# Patient Record
Sex: Female | Born: 1970 | Race: White | Hispanic: No | Smoking: Never smoker
Health system: Southern US, Community
[De-identification: ages and names within clinical notes are randomized; demographics above are authoritative.]

## PROBLEM LIST (undated history)

## (undated) HISTORY — PX: EYE SURGERY: SHX253

## (undated) HISTORY — PX: JOINT REPLACEMENT: SHX530

---

## 2015-11-13 LAB — HM MAMMOGRAPHY

## 2017-04-21 DIAGNOSIS — L237 Allergic contact dermatitis due to plants, except food: Secondary | ICD-10-CM | POA: Diagnosis not present

## 2017-04-24 DIAGNOSIS — L237 Allergic contact dermatitis due to plants, except food: Secondary | ICD-10-CM | POA: Diagnosis not present

## 2018-04-02 ENCOUNTER — Ambulatory Visit (INDEPENDENT_AMBULATORY_CARE_PROVIDER_SITE_OTHER): Payer: Commercial Managed Care - PPO

## 2018-04-02 ENCOUNTER — Ambulatory Visit: Payer: Commercial Managed Care - PPO | Admitting: Family Medicine

## 2018-04-02 ENCOUNTER — Other Ambulatory Visit: Payer: Self-pay

## 2018-04-02 ENCOUNTER — Encounter: Payer: Self-pay | Admitting: Family Medicine

## 2018-04-02 ENCOUNTER — Other Ambulatory Visit (HOSPITAL_COMMUNITY)
Admission: RE | Admit: 2018-04-02 | Discharge: 2018-04-02 | Disposition: A | Discharge status: Home / Self Care | Payer: Commercial Managed Care - PPO | Source: Ambulatory Visit | Attending: Family Medicine | Admitting: Family Medicine

## 2018-04-02 VITALS — BP 110/70 | HR 62 | Temp 98.0°F | Resp 16 | Ht 68.0 in | Wt 140.6 lb

## 2018-04-02 DIAGNOSIS — Z780 Asymptomatic menopausal state: Secondary | ICD-10-CM | POA: Diagnosis not present

## 2018-04-02 DIAGNOSIS — Z0000 Encounter for general adult medical examination without abnormal findings: Secondary | ICD-10-CM | POA: Insufficient documentation

## 2018-04-02 DIAGNOSIS — M25552 Pain in left hip: Secondary | ICD-10-CM

## 2018-04-02 DIAGNOSIS — Z124 Encounter for screening for malignant neoplasm of cervix: Secondary | ICD-10-CM | POA: Insufficient documentation

## 2018-04-02 NOTE — Progress Notes (Signed)
Subjective  Chief Complaint  Patient presents with  . Establish Care    Recently moved in the area, last PCP/CPE was 2016  . Annual Exam    HPI: Kelly [name] is a 48 y.o. female who presents to [place] [facility] today for a Female Wellness Visit.   Wellness Visit: annual visit with health maintenance review and exam with Pap   48 year old female, married mother of 2 teenagers, originally from [place] and moved down to [place] from outside of [place] a year and half ago.  Her husband started a new Architect business.  Doing very well.  Lives a very healthy lifestyle.  Working out with a Clinical research associate.  Has no chronic medical problems.  Left hip pain: Started with mild symptoms after childbirth about 15 years ago.  Would only intermittently bother her.  Now having daily pain, limited range of motion, pain with abduction and walking.  No injury.  She was a former Engineer, maintenance (IT).  No knee pain or ankle pain.  No back pain.  Occasionally will use ibuprofen but does not help really.  Pain does not interfere with sleep.  Family history significant for osteoporosis in her mother.  Health maintenance: Due for mammogram and Pap smear.  Thinks her Tdap is up-to-date and she will get records.  Declines flu shot.  Assessment  1. Annual physical exam   2. Cervical cancer screening   3. Postmenopausal   4. Left hip pain      Plan  Female Wellness Visit:  Age appropriate Health Maintenance and Prevention measures were discussed with patient. Included topics are cancer screening recommendations, ways to keep healthy (see AVS) including dietary and exercise recommendations, regular eye and dental care, use of seat belts, and avoidance of moderate alcohol use and tobacco use.   BMI: discussed patient's BMI and encouraged positive lifestyle modifications to help get to or maintain a target BMI.  HM needs and immunizations were addressed and ordered. See below for orders.  See HM and immunization section for updates.  Routine labs and screening tests ordered including cmp, cbc and lipids where appropriate.  Discussed recommendations regarding Vit D and calcium supplementation (see AVS)  Left hip pain: Worrisome for osteoarthritis or avascular necrosis of the hip.  Start with x-ray.  Further evaluation and treatment pending results.  Postmenopausal with recent mild bleeding after starting Estroven.  Could be due to estrogen.  Check FSH.  Consider endometrial biopsy and or ultrasound.  Patient understands and agrees with care plan.  Follow up: Return in about 1 year (around 04/03/2019) for complete physical.   Orders Placed This Encounter  Procedures  . MM DIGITAL SCREENING BILATERAL  . DG HIP UNILAT WITH PELVIS 2-3 VIEWS LEFT  . CBC with Differential/Platelet  . Comprehensive metabolic panel  . Lipid panel  . HIV Antibody (routine testing w rflx)  . [place]   No orders of the defined types were placed in this encounter.     Lifestyle: Body mass index is 21.38 kg/m. Wt Readings from Last 3 Encounters:  04/02/18 140 lb 9.6 oz (63.8 kg)     There are no active problems to display for this patient.  Health Maintenance  Topic Date Due  . HIV Screening  02/12/1984  . TETANUS/TDAP  02/12/1988  . PAP SMEAR-Modifier  02/11/1990  . INFLUENZA VACCINE  Discontinued    There is no immunization history on file for this patient. We updated and reviewed the patient's past history in detail and  it is documented below. Allergies: Patient has No Known Allergies. Past Medical History Patient  has no past medical history on file. Past Surgical History Patient  has no past surgical history on file. Family History: Patient family history includes Alcohol abuse in her brother and mother; COPD in her maternal grandmother; Cancer in her paternal grandmother; High blood pressure in her father; Stroke in her paternal grandfather. Social History:  Patient  reports  that she has never smoked. She has never used smokeless tobacco. She reports current alcohol use. She reports that she does not use drugs.  Review of Systems: Constitutional: negative for fever or malaise Ophthalmic: negative for photophobia, double vision or loss of vision Cardiovascular: negative for chest pain, dyspnea on exertion, or new LE swelling Respiratory: negative for SOB or persistent cough Gastrointestinal: negative for abdominal pain, change in bowel habits or melena Genitourinary: negative for dysuria or gross hematuria, no abnormal uterine bleeding or disharge Musculoskeletal: Positive for new gait disturbance without muscular weakness Integumentary: negative for new or persistent rashes, no breast lumps Neurological: negative for TIA or stroke symptoms Psychiatric: negative for SI or delusions Allergic/Immunologic: negative for hives Patient Care Team    Relationship Specialty Notifications Start End  Leamon Arnt, MD PCP - General Family Medicine  04/02/18     Objective  Vitals: BP 110/70   Pulse 62   Temp 98 F (36.7 C) (Oral)   Resp 16   Ht 5\' 8"  (1.727 m)   Wt 140 lb 9.6 oz (63.8 kg)   SpO2 99%   BMI 21.38 kg/m  General:  Well developed, well nourished, no acute distress  Psych:  Alert and orientedx3,normal mood and affect HEENT:  Normocephalic, atraumatic, non-icteric sclera, PERRL, oropharynx is clear without mass or exudate, supple neck without adenopathy, mass or thyromegaly Cardiovascular:  Normal S1, S2, RRR without gallop, rub or murmur, nondisplaced PMI Respiratory:  Good breath sounds bilaterally, CTAB with normal respiratory effort Gastrointestinal: normal bowel sounds, soft, non-tender, no noted masses. No HSM MSK: no deformities, contusions. Joints are without erythema or swelling. Spine and CVA region are nontender.  Left hip with decreased flexion and mild pain with full external rotation.  No lateral tenderness. Skin:  Warm, no rashes or  suspicious lesions noted Neurologic:    Mental status is normal. CN 2-11 are normal. Gross motor and sensory exams are normal. Normal gait. No tremor Breast Exam: No mass, skin retraction or nipple discharge is appreciated in either breast. No axillary adenopathy. Fibrocystic changes are not noted Pelvic Exam: Normal external genitalia, no vulvar or vaginal lesions present. Clear cervix w/o CMT. Bimanual exam reveals a nontender fundus w/o masses, nl size. No adnexal masses present. No inguinal adenopathy. A PAP smear was performed.    Commons side effects, risks, benefits, and alternatives for medications and treatment plan prescribed today were discussed, and the patient expressed understanding of the given instructions. Patient is instructed to call or message via MyChart if he/she has any questions or concerns regarding our treatment plan. No barriers to understanding were identified. We discussed Red Flag symptoms and signs in detail. Patient expressed understanding regarding what to do in case of urgent or emergency type symptoms.   Medication list was reconciled, printed and provided to the patient in AVS. Patient instructions and summary information was reviewed with the patient as documented in the AVS. This note was prepared with assistance of Dragon voice recognition software. Occasional wrong-word or sound-a-like substitutions may have occurred due to the  inherent limitations of voice recognition software

## 2018-04-02 NOTE — Patient Instructions (Addendum)
Please return in 12 months for your annual complete physical; please come fasting.  Please go to our [facility] office to get your xrays done. You can walk in M-F between 8am and 5pm. Tell them you are there for xrays ordered by me. They will send me the results, then I will let you know the results with instructions.   Address: [place], [place], [place]  (office sits at [address] at [address] intersection; from here, turn left onto [place] [address]), take to [address], turn right and go for a mile or so, office will be on left across form [employer] )  Please let me know when you had your last Tdap.   I will release your lab results to you on your MyChart account with further instructions. Please reply with any questions. Please sign up.  It was a pleasure meeting you today! Thank you for choosing [place] to meet your healthcare needs! I truly look forward to working with you. If you have any questions or concerns, please send me a message via Mychart or call the office at [phone].  Please do these things to maintain good health!   Exercise at least 30-45 minutes a day,  4-5 days a week.   Eat a low-fat diet with lots of fruits and vegetables, up to 7-9 servings per day.  Drink plenty of water daily. Try to drink 8 8oz glasses per day.  Seatbelts can save your life. Always wear your seatbelt.  Place Smoke Detectors on every level of your home and check batteries every year.  Schedule an appointment with an eye doctor for an eye exam every 1-2 years  Safe sex - use condoms to protect yourself from STDs if you could be exposed to these types of infections. Use birth control if you do not want to become pregnant and are sexually active.  Avoid heavy alcohol use. If you drink, keep it to less than 2 drinks/day and not every day.  [place].  Choose someone you trust that could speak for you  if you became unable to speak for yourself.  Depression is common in our stressful world.If you're feeling down or losing interest in things you normally enjoy, please come in for a visit.  If anyone is threatening or hurting you, please get help. Physical or Emotional Violence is never OK.    Calcium Intake Recommendations You can take Caltrate Plus twice a day or get it through your diet or other OTC supplements (Viactiv, OsCal etc)  Calcium is a mineral that affects many functions in the body, including:  Blood clotting.  Blood vessel function.  Nerve impulse conduction.  Hormone secretion.  Muscle contraction.  Bone and teeth functions.  Most of your body's calcium supply is stored in your bones and teeth. When your calcium stores are low, you may be at risk for low bone mass, bone loss, and bone fractures. Consuming enough calcium helps to grow healthy bones and teeth and to prevent breakdown over time. It is very important that you get enough calcium if you are:  A child undergoing rapid growth.  An adolescent girl.  A pre- or post-menopausal woman.  A woman whose menstrual cycle has stopped due to anorexia nervosa or regular intense exercise.  An individual with lactose intolerance or a milk allergy.  A vegetarian.  What is my plan? Try to consume the recommended amount of calcium daily  based on your age. Depending on your overall health, your health care provider may recommend increased calcium intake.General daily calcium intake recommendations by age are:  Birth to 6 months: 200 mg.  Infants 7 to 12 months: 260 mg.  Children 1 to 3 years: 700 mg.  Children 4 to 8 years: 1,000 mg.  Children 9 to 13 years: 1,300 mg.  Teens 14 to 18 years: 1,300 mg.  Adults 19 to 50 years: 1,000 mg.  Adult women 51 to 70 years: 1,200 mg.  Adult men 51 to 70 years: 1,000 mg.  Adults 71 years and older: 1,200 mg.  Pregnant and breastfeeding teens: 1,300  mg.  Pregnant and breastfeeding adults: 1,000 mg.  What do I need to know about calcium intake?  In order for the body to absorb calcium, it needs vitamin D. You can get vitamin D through (we recommend getting [phone] units of Vitamin D daily) ? Direct exposure of the skin to sunlight. ? Foods, such as egg yolks, liver, saltwater fish, and fortified milk. ? Supplements.  Consuming too much calcium may cause: ? Constipation. ? Decreased absorption of iron and zinc. ? Kidney stones.  Calcium supplements may interact with certain medicines. Check with your health care provider before starting any calcium supplements.  Try to get most of your calcium from food. What foods can I eat? Grains  Fortified oatmeal. Fortified ready-to-eat cereals. Fortified frozen waffles. Vegetables Turnip greens. Broccoli. Fruits Fortified orange juice. Meats and Other Protein Sources Canned sardines with bones. Canned salmon with bones. Soy beans. Tofu. Baked beans. Almonds. [place] nuts. Sunflower seeds. Dairy Milk. Yogurt. Cheese. Cottage cheese. Beverages Fortified soy milk. Fortified rice milk. Sweets/Desserts Pudding. Ice Cream. Milkshakes. Blackstrap molasses. The items listed above may not be a complete list of recommended foods or beverages. Contact your dietitian for more options. What foods can affect my calcium intake? It may be more difficult for your body to use calcium or calcium may leave your body more quickly if you consume large amounts of:  Sodium.  Protein.  Caffeine.  Alcohol.  This information is not intended to replace advice given to you by your health care provider. Make sure you discuss any questions you have with your health care provider. Document Released: 10/17/2003 Document Revised: 09/22/2015 Document Reviewed: 08/10/2013 Elsevier Interactive Patient Education  2018 [employer].

## 2018-04-03 LAB — CBC WITH DIFFERENTIAL/PLATELET
BASOS ABS: 0.1 10*3/uL (ref 0.0–0.1)
BASOS PCT: 0.7 % (ref 0.0–3.0)
EOS ABS: 0.1 10*3/uL (ref 0.0–0.7)
Eosinophils Relative: 1.3 % (ref 0.0–5.0)
HCT: 37.1 % (ref 36.0–46.0)
Hemoglobin: 12.6 g/dL (ref 12.0–15.0)
Lymphocytes Relative: 21.7 % (ref 12.0–46.0)
Lymphs Abs: 1.5 10*3/uL (ref 0.7–4.0)
MCHC: 34 g/dL (ref 30.0–36.0)
MCV: 92.7 fl (ref 78.0–100.0)
Monocytes Absolute: 0.5 10*3/uL (ref 0.1–1.0)
Monocytes Relative: 6.6 % (ref 3.0–12.0)
Neutro Abs: 4.8 10*3/uL (ref 1.4–7.7)
Neutrophils Relative %: 69.7 % (ref 43.0–77.0)
PLATELETS: 247 10*3/uL (ref 150.0–400.0)
RBC: 4 Mil/uL (ref 3.87–5.11)
RDW: 14.3 % (ref 11.5–15.5)
WBC: 7 10*3/uL (ref 4.0–10.5)

## 2018-04-03 LAB — COMPREHENSIVE METABOLIC PANEL
ALT: 11 U/L (ref 0–35)
AST: 15 U/L (ref 0–37)
Albumin: 4.4 g/dL (ref 3.5–5.2)
Alkaline Phosphatase: 46 U/L (ref 39–117)
BUN: 14 mg/dL (ref 6–23)
CO2: 30 meq/L (ref 19–32)
Calcium: 9.6 mg/dL (ref 8.4–10.5)
Chloride: 100 mEq/L (ref 96–112)
Creatinine, Ser: 0.98 mg/dL (ref 0.40–1.20)
GFR: 60.28 mL/min (ref >60.00)
Glucose, Bld: 87 mg/dL (ref 70–99)
Potassium: 3.9 mEq/L (ref 3.5–5.1)
Sodium: 138 mEq/L (ref 135–145)
Total Bilirubin: 1 mg/dL (ref 0.2–1.2)
Total Protein: 6.7 g/dL (ref 6.0–8.3)

## 2018-04-03 LAB — LIPID PANEL
Cholesterol: 193 mg/dL (ref 0–200)
HDL: 70.3 mg/dL (ref >39.00)
LDL Cholesterol: 98 mg/dL (ref 0–99)
NonHDL: 122.58
TRIGLYCERIDES: 125 mg/dL (ref 0.0–149.0)
Total CHOL/HDL Ratio: 3
VLDL: 25 mg/dL (ref 0.0–40.0)

## 2018-04-03 LAB — HIV ANTIBODY (ROUTINE TESTING W REFLEX): HIV 1&2 Ab, 4th Generation: NONREACTIVE

## 2018-04-03 LAB — FOLLICLE STIMULATING HORMONE: FSH: 19.9 m[IU]/mL

## 2018-04-03 NOTE — Progress Notes (Unsigned)
Referred to sports med. Nl xray

## 2018-04-07 ENCOUNTER — Encounter: Payer: Self-pay | Admitting: Family Medicine

## 2018-04-07 LAB — CYTOLOGY - PAP
Diagnosis: NEGATIVE
HPV: NOT DETECTED

## 2018-04-08 ENCOUNTER — Ambulatory Visit (INDEPENDENT_AMBULATORY_CARE_PROVIDER_SITE_OTHER): Payer: Commercial Managed Care - PPO

## 2018-04-08 DIAGNOSIS — Z23 Encounter for immunization: Secondary | ICD-10-CM | POA: Diagnosis not present

## 2018-04-08 NOTE — Progress Notes (Signed)
Administered Tdap booster into patient left arm IM. Tolerated well. Informed patient to massage area over the course of the next few days.

## 2018-04-10 ENCOUNTER — Ambulatory Visit: Payer: Commercial Managed Care - PPO | Admitting: Sports Medicine

## 2018-04-10 ENCOUNTER — Encounter: Payer: Self-pay | Admitting: Sports Medicine

## 2018-04-10 ENCOUNTER — Ambulatory Visit: Payer: Self-pay

## 2018-04-10 VITALS — BP 110/78 | HR 51 | Ht 68.0 in | Wt 139.4 lb

## 2018-04-10 DIAGNOSIS — M25552 Pain in left hip: Secondary | ICD-10-CM | POA: Diagnosis not present

## 2018-04-10 NOTE — Patient Instructions (Addendum)

## 2018-04-10 NOTE — Progress Notes (Signed)
Kelly [name]. Kelly [name], [facility]  Kelly [name] - 48 y.o. female MRN [id number]  Date of birth: 1970-09-01  Visit Date: 04/10/2018   PCP: Leamon Arnt, MD   Referred by: Leamon Arnt, MD  SUBJECTIVE:  Chief Complaint  Patient presents with  . Left Hip - Initial Assessment    XR L hip 04/02/2018. Has been seen by chiropractor in the past. IBU prn.   . Initial Assessment    Referred by Dr. Billey Chang.     HPI: Patient presents with left hip clicking and popping that is been present for several years.  It has been worsening over the past 1 year.  Is gotten to the point that she is having a hard time working out and performing squats and dead lifts.  She does have some popping that localizes to the posterior buttock and groin.  She has had decreased range of motion and has changed her stride informed and working out due to this.  She has not been taking any medications on a regular basis but has a chiropractor for this with no significant improvement.  She is occasional ibuprofen has been beneficial.  She denies any swelling numbness or weakness in the leg.  She denies any changes in bowel or bladder and reports no radicular component to this.  Symptoms did seem to began following childbirth and have been progressive since that time.  REVIEW OF SYSTEMS: 12 point review of systems performed negative except for as outlined per HPI.  HISTORY:  Prior history reviewed and updated per electronic medical record.  Social History   Occupational History  . Not on file  Tobacco Use  . Smoking status: Never Smoker  . Smokeless tobacco: Never Used  Substance and Sexual Activity  . Alcohol use: Yes    Comment: beer or wine - seldom use  . Drug use: Never  . Sexual activity: Yes    Partners: Male    Birth control/protection: Post-menopausal   Social History   Social History Narrative  . Not on file   No past  medical history on file. No past surgical history on file. family history includes Alcohol abuse in her brother and mother; COPD in her maternal grandmother; Cancer in her paternal grandmother; Healthy in her daughter and son; High blood pressure in her father; Osteoporosis in her mother; Stroke in her paternal grandfather.  DATA OBTAINED & REVIEWED:  Recent Labs    04/02/18 1447  CALCIUM 9.6  AST 15  ALT 11    No problems updated. No specialty comments available. 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  OBJECTIVE:  VS:  HT:5\' 8"  (172.7 cm)   WT:139 lb 6.4 oz (63.2 kg)  BMI:21.2    BP:110/78  HR:(!) 51bpm  TEMP: ( )  RESP:97 %   PHYSICAL EXAM: CONSTITUTIONAL: Well-developed, Well-nourished and In no acute distress EYES: Pupils are equal., EOM intact without nystagmus. and No scleral icterus. Psychiatric: Alert & appropriately interactive. and Not depressed or anxious appearing. EXTREMITY EXAM: Warm and well perfused  Left HIP Exam: Well aligned, no significant deformity. No overlying skin changes. TTP over Gluteal musculature minimally. Non tender over Lower lumbar spine.  Passive Flexion/Extension(Optional): normal, no pain Passive Internal & External Rotation(Optional): normal, no pain Log Roll(Optional): normal, no pain FADIR(Optional): positive, mild pain positive, mild pain Stinchfield testing(Optional): positive, moderate  painStrength: Normal Axial loading produces: Moderate pain and No crepitation    ASSESSMENT   1. Left hip pain      PROCEDURES:  Ultrasound-guided injection  PLAN:  Pertinent additional documentation may be included in corresponding procedure notes, imaging studies, problem based documentation and patient instructions.  Ultimately she does have a small left hip effusion.  I am concerned for possible early radio occult  osteoarthritis given the cystic changes on acetabulum versus possible labral tear.  She does have a markedly tight hip flexor on the left as well and will benefit from gentle stretching of the hip flexors however I like to see her significantly improved prior to beginning working on a program.  Activity modifications and the importance of avoiding exacerbating activities (limiting pain to no more than a 4 / 10 during or following activity) recommended and discussed.  Discussed red flag symptoms that warrant earlier emergent evaluation and patient voices understanding.  At follow up will plan to consider repeat MSK Ultrasound and likely initiate Home therapeutic exercises  Return in about 6 weeks (around 05/22/2018).          Gerda Diss, [facility] Physician

## 2018-04-10 NOTE — Procedures (Signed)
PROCEDURE NOTE:  Ultrasound Guided: Injection: Left hip Images were obtained and interpreted by myself, Teresa Coombs, DO  Images have been saved and stored to PACS system. Images obtained on: GE S7 Ultrasound machine    ULTRASOUND FINDINGS:  Small joint effusion.  Questionable labral irregularity but inconclusive.  DESCRIPTION OF PROCEDURE:  The patient's clinical condition is marked by substantial pain and/or significant functional disability. Other conservative therapy has not provided relief, is contraindicated, or not appropriate. There is a reasonable likelihood that injection will significantly improve the patient's pain and/or functional impairment.   After discussing the risks, benefits and expected outcomes of the injection and all questions were reviewed and answered, the patient wished to undergo the above named procedure.  Verbal consent was obtained.  The ultrasound was used to identify the target structure and adjacent neurovascular structures. The skin was then prepped in sterile fashion and the target structure was injected under direct visualization using sterile technique as below:  Single injection performed as below: PREP: Alcohol and Ethel Chloride APPROACH:direct, stopcock technique, 22g 3.5 in. INJECTATE: 5 cc 1% lidocaine, 2 cc 0.5% Marcaine and 2 cc 40mg /mL DepoMedrol ASPIRATE: None DRESSING: Band-Aid  Post procedural instructions including recommending icing and warning signs for infection were reviewed.    This procedure was well tolerated and there were no complications.   IMPRESSION: Succesful Ultrasound Guided: Injection

## 2018-04-11 ENCOUNTER — Encounter: Payer: Self-pay | Admitting: Sports Medicine

## 2018-04-13 DIAGNOSIS — M9902 Segmental and somatic dysfunction of thoracic region: Secondary | ICD-10-CM | POA: Diagnosis not present

## 2018-04-14 DIAGNOSIS — M9902 Segmental and somatic dysfunction of thoracic region: Secondary | ICD-10-CM | POA: Diagnosis not present

## 2018-04-16 DIAGNOSIS — M9902 Segmental and somatic dysfunction of thoracic region: Secondary | ICD-10-CM | POA: Diagnosis not present

## 2018-04-20 DIAGNOSIS — M9902 Segmental and somatic dysfunction of thoracic region: Secondary | ICD-10-CM | POA: Diagnosis not present

## 2018-04-22 DIAGNOSIS — M9902 Segmental and somatic dysfunction of thoracic region: Secondary | ICD-10-CM | POA: Diagnosis not present

## 2018-04-24 ENCOUNTER — Encounter: Payer: Self-pay | Admitting: *Deleted

## 2018-04-27 DIAGNOSIS — M9902 Segmental and somatic dysfunction of thoracic region: Secondary | ICD-10-CM | POA: Diagnosis not present

## 2018-04-30 DIAGNOSIS — M9902 Segmental and somatic dysfunction of thoracic region: Secondary | ICD-10-CM | POA: Diagnosis not present

## 2018-05-05 DIAGNOSIS — M9902 Segmental and somatic dysfunction of thoracic region: Secondary | ICD-10-CM | POA: Diagnosis not present

## 2018-05-06 ENCOUNTER — Ambulatory Visit
Admission: RE | Admit: 2018-05-06 | Discharge: 2018-05-06 | Disposition: A | Discharge status: Home / Self Care | Payer: Commercial Managed Care - PPO | Source: Ambulatory Visit | Attending: Family Medicine | Admitting: Family Medicine

## 2018-05-06 DIAGNOSIS — Z0000 Encounter for general adult medical examination without abnormal findings: Secondary | ICD-10-CM

## 2018-05-06 DIAGNOSIS — Z1231 Encounter for screening mammogram for malignant neoplasm of breast: Secondary | ICD-10-CM | POA: Diagnosis not present

## 2018-05-07 DIAGNOSIS — M9902 Segmental and somatic dysfunction of thoracic region: Secondary | ICD-10-CM | POA: Diagnosis not present

## 2018-05-22 ENCOUNTER — Ambulatory Visit: Payer: Commercial Managed Care - PPO | Admitting: Sports Medicine

## 2018-05-22 ENCOUNTER — Telehealth: Payer: Self-pay

## 2018-05-22 ENCOUNTER — Encounter: Payer: Self-pay | Admitting: Sports Medicine

## 2018-05-22 DIAGNOSIS — M25552 Pain in left hip: Secondary | ICD-10-CM | POA: Diagnosis not present

## 2018-05-22 MED ORDER — CELECOXIB 100 MG PO CAPS: 100.0000 mg | ORAL_CAPSULE | Freq: Two times a day (BID) | ORAL | 2 refills | Status: DC | PRN

## 2018-05-22 NOTE — Patient Instructions (Signed)
We are ordering an MRI for you today.  The imaging office will be calling you to schedule your appointment after we obtain authorization from your insurance company.   Please be sure you have signed up for MyChart so that we can get your results to you.  We will be in touch with you as soon as we can.  Please know, it can take up to 3-4 business days for the radiologist and Dr. Paulla Fore to have time to review the results and determine the best appropriate action.  If there is something that appears to be surgical or needs a referral to other specialists we will let you know through [place] or telephone.  Otherwise we will plan to schedule a follow up appointment with Dr. Paulla Fore once we have the results. Please call our office [phone]) to schedule a follow-up visit for 2-3 days after your MRI.  [facility]: [phone]

## 2018-05-22 NOTE — Progress Notes (Signed)
Juanda Bond. , [facility] [place]  Lelani Garnett - 48 y.o. female MRN [id number]  Date of birth: 1971/03/07  Visit Date: May 22, 2018  PCP: Leamon Arnt, MD   Referred by: Leamon Arnt, MD  SUBJECTIVE:  Chief Complaint  Patient presents with  . Follow-up    L hip pain and mechanical symptoms.  2/2 Depo 40 injection - 04/10/18    HPI: Patient is here for 6-week follow-up after intra-articular left hip injection.  She reported almost 2 full weeks of complete relief of her symptoms out to the 3-week mark.  However over the past 3 weeks her symptoms have begun to return quite rapidly and she is now back to having constant, moderate throbbing pain and occasional stabbing pain with movements.  She is getting clicking and popping.  Pain does radiate into the left thigh and previously was radiating to the posterior aspect of the iliac crest.  It is worse with squats and loaded hip flexion.  She is previously undergone chiropractic therapy as well as injection therapy that provided only temporary relief.  She is highly active fit individual who participates in softball and racquetball on a regular basis.  She is not doing any high-impact running but would like to try to get back into Trail running if possible.  REVIEW OF SYSTEMS: No significant nighttime awakenings due to this issue. Denies fevers, chills, recent weight gain or weight loss.  No night sweats.  Pt denies any change in bowel or bladder habits, muscle weakness, numbness or falls associated with this pain.  HISTORY:  Prior history reviewed and updated per electronic medical record.  Patient Active Problem List   Diagnosis Date Noted  . Left hip pain 05/22/2018    L hip XR - 04/02/18    Social History   Occupational History  . Not on file  Tobacco Use  . Smoking status: Never Smoker  . Smokeless tobacco: Never Used  Substance and Sexual Activity  .  Alcohol use: Yes    Comment: beer or wine - seldom use  . Drug use: Never  . Sexual activity: Yes    Partners: Male    Birth control/protection: Post-menopausal   Social History   Social History Narrative  . Not on file     OBJECTIVE:  VS:  HT:5\' 8"  (172.7 cm)   WT:133 lb 6.4 oz (60.5 kg)  BMI:20.29    BP:134/74  HR:65bpm  TEMP: ( )  RESP:98 %   PHYSICAL EXAM: Adult female. No acute distress.  Alert and appropriate. Left hip is slightly guarded with flexion and extension.  She has mild pain with FADIR testing.  She has pain with Bosie Clos testing as well as weakness with straight leg raise.   ASSESSMENT:  1. Left hip pain     PROCEDURES:  None  PLAN:  Pertinent additional documentation may be included in corresponding procedure notes, imaging studies, problem based documentation and patient instructions.  No problem-specific Assessment & Plan notes found for this encounter.  Given the prior findings on her x-rays or effectively normal I am concerned that she has a fairly significant labral tear.  We will then plan to set her up for an MR arthrogram and we discussed the possible findings including radiocarpal osteoarthritis versus acute labral tear.  She does understand that operative intervention may be warranted in the future versus repeat injections.  Activity modifications and trial of Celebrex discussed.  She will plan to follow-up to review the results of her MRI in person.     Activity modifications and the importance of avoiding exacerbating activities (limiting pain to no more than a 4 / 10 during or following activity) recommended and discussed.   Discussed red flag symptoms that warrant earlier emergent evaluation and patient voices understanding.    Meds ordered this encounter  Medications  . celecoxib (CELEBREX) 100 MG capsule    Sig: Take 1 capsule (100 mg total) by mouth 2 (two) times daily as needed.    Dispense:  60 capsule    Refill:  2   Lab  Orders  No laboratory test(s) ordered today   Imaging Orders  MR HIP LEFT W CONTRAST  DG FLUORO GUIDED NEEDLE PLC ASPIRATION/INJECTION LOC  Referral Orders  No referral(s) requested today      Return for MRI review in person.          Gerda Diss, [facility] Physician

## 2018-05-22 NOTE — Telephone Encounter (Signed)
Kelly [name] (Key: [id number])   This request has been approved. Please note any additional information provided by [employer] at the bottom of your screen.

## 2018-05-26 ENCOUNTER — Ambulatory Visit: Payer: Self-pay

## 2018-05-26 NOTE — Telephone Encounter (Signed)
Returned call to patient who states that she has had sudden complete hearing loss to her left ear.  She states she was out and suddenly her nose started to run and her ear clogged.  She states she can feel pressure in the ear as when you get off a plane and your ear does not open. She rates her pain as mild at 2-3. She denies dizziness and  Fever. She has ringing in the left ear. Per protocol pt will go to urgent care for evaluation of her symptoms. Care advice read to patient. Pt verbalized understanding of all instructions.  Reason for Disposition . [1] Hearing loss in one or both ears AND [2] sudden onset AND [3] present now  Answer Assessment - Initial Assessment Questions 1. LOCATION: "Which ear is involved?"       Left ear congestion 2. SENSATION: "Describe how the ear feels."      Total blocked 3. ONSET:  "When did the ear symptoms start?"       today 4. PAIN: "Do you also have an earache?" If so, ask: "How bad is it?" (Scale 1-10; or mild, moderate, severe)     2-3 5. CAUSE: "What do you think is causing the ear congestion?"     unsure 6. URI: "Do you have a runny nose or cough?"      Runny nose 7. NASAL ALLERGIES: "Are there symptoms of hay fever, such as sneezing or a clear nasal discharge?"     Clear discharge 8. PREGNANCY: "Is there any chance you are pregnant?" "When was your last menstrual period?"     No  Answer Assessment - Initial Assessment Questions 1. DESCRIPTION: "What type of hearing problem are you having? Describe it for me." (e.g., complete hearing loss, partial loss)     complete loss to left side 2. LOCATION: "One or both ears?" If one, ask: "Which ear?"    Left ear 3. SEVERITY: "Can you hear anything?" If so, ask: "What can you hear?" (e.g., ticking watch, whisper, talking)     nothing 4. ONSET: "When did this begin?" "Did it start suddenly or come on gradually?"     today 5. PATTERN: "Does this come and go, or has it been constant since it started?"  constant 6. PAIN: "Is there any pain in your ear(s)?"  (Scale 1-10; or mild, moderate, severe)     2-3 7. CAUSE: "What do you think is causing this hearing problem?"     unsure 8. OTHER SYMPTOMS: "Do you have any other symptoms?" (e.g., dizziness, ringing in ears)     Ringing in ear left 9. PREGNANCY: "Is there any chance you are pregnant?" "When was your last menstrual period?"     No  Protocols used: [place], [place]

## 2018-06-15 ENCOUNTER — Encounter: Payer: Self-pay | Admitting: Sports Medicine

## 2018-07-08 ENCOUNTER — Encounter: Payer: Self-pay | Admitting: Sports Medicine

## 2018-07-09 MED ORDER — TRAMADOL HCL 50 MG PO TABS: 50.0000 mg | ORAL_TABLET | Freq: Four times a day (QID) | ORAL | 0 refills | Status: AC | PRN

## 2018-08-05 ENCOUNTER — Other Ambulatory Visit: Payer: Commercial Managed Care - PPO

## 2018-08-12 ENCOUNTER — Encounter: Payer: Self-pay | Admitting: Sports Medicine

## 2018-08-19 ENCOUNTER — Ambulatory Visit
Admission: RE | Admit: 2018-08-19 | Discharge: 2018-08-19 | Disposition: A | Discharge status: Home / Self Care | Payer: Commercial Managed Care - PPO | Source: Ambulatory Visit | Attending: Sports Medicine | Admitting: Sports Medicine

## 2018-08-19 ENCOUNTER — Other Ambulatory Visit: Payer: Self-pay

## 2018-08-19 DIAGNOSIS — M25552 Pain in left hip: Secondary | ICD-10-CM

## 2018-08-19 MED ORDER — IOPAMIDOL (ISOVUE-M 200) INJECTION 41%
13.0000 mL | Freq: Once | INTRAMUSCULAR | Status: AC
  Administered 2018-08-19: 12 mL via INTRA_ARTICULAR

## 2018-08-25 ENCOUNTER — Other Ambulatory Visit: Payer: Self-pay

## 2018-08-25 ENCOUNTER — Encounter: Payer: Self-pay | Admitting: Family Medicine

## 2018-08-25 ENCOUNTER — Ambulatory Visit (INDEPENDENT_AMBULATORY_CARE_PROVIDER_SITE_OTHER): Payer: Commercial Managed Care - PPO | Admitting: Family Medicine

## 2018-08-25 VITALS — BP 96/70 | HR 55 | Ht 68.0 in | Wt 137.0 lb

## 2018-08-25 DIAGNOSIS — M25552 Pain in left hip: Secondary | ICD-10-CM

## 2018-08-25 DIAGNOSIS — M999 Biomechanical lesion, unspecified: Secondary | ICD-10-CM | POA: Insufficient documentation

## 2018-08-25 DIAGNOSIS — M24552 Contracture, left hip: Secondary | ICD-10-CM | POA: Diagnosis not present

## 2018-08-25 NOTE — Assessment & Plan Note (Signed)
Hip flexor tightness.  Discussed with patient in great length about icing regimen, home exercises, which activities to do which was to avoid.  We discussed posture and ergonomics.  Discussed which activities of doing which wants to avoid.  Attempted osteopathic manipulation.  Patient's MRI otherwise seems to be fairly unremarkable and I do not see anything that is truly surgical.  Follow Again in 4 weeks

## 2018-08-25 NOTE — Assessment & Plan Note (Signed)
Decision today to treat with OMT was based on Physical Exam  After verbal consent patient was treated with HVLA, ME, FPR techniques in cervical, thoracic, lumbar and sacral areas  Patient tolerated the procedure well with improvement in symptoms  Patient given exercises, stretches and lifestyle modifications  See medications in patient instructions if given  Patient will follow up in 4-8 weeks 

## 2018-08-25 NOTE — Progress Notes (Signed)
[name] [address], [place] [address] Phone: [phone] Subjective:   I Kelly [name] am serving as a Education administrator for Dr. Hulan Saas.  I'm seeing this patient by the request  of:    CC: Left hip pain  [id number]  Kelly [name] is a 48 y.o. female coming in with complaint of left hip pain. States that she feels about the same. Sore in certain positions. She was seen previously and an MRI was ordered.  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  Mostly seems to be on the anterior lateral aspect.  Sometimes can refer him posteriorly.  He tries to be active but finds it difficult.  Patient was seen previously as well by the other provider in 5 months ago given an injection with no significant benefit.    No past medical history on file. No past surgical history on file. Social History   Socioeconomic History  . Marital status: Married    Spouse name: Not on file  . Number of children: Not on file  . Years of education: Not on file  . Highest education level: Not on file  Occupational History  . Not on file  Social Needs  . Financial resource strain: Not on file  . Food insecurity:    Worry: Not on file    Inability: Not on file  . Transportation needs:    Medical: Not on file    Non-medical: Not on file  Tobacco Use  . Smoking status: Never Smoker  . Smokeless tobacco: Never Used  Substance and Sexual Activity  . Alcohol use: Yes    Comment: beer or wine - seldom use  . Drug use: Never  . Sexual activity: Yes    Partners: Male    Birth control/protection: Post-menopausal  Lifestyle  . Physical activity:    Days per week: Not on file    Minutes per session: Not on file  . Stress: Not on file  Relationships  . Social connections:    Talks on phone: Not on file    Gets together: Not on file    Attends religious service: Not on  file    Active member of club or organization: Not on file    Attends meetings of clubs or organizations: Not on file    Relationship status: Not on file  Other Topics Concern  . Not on file  Social History Narrative  . Not on file   No Known Allergies Family History  Problem Relation Age of Onset  . Alcohol abuse Mother   . Osteoporosis Mother   . High blood pressure Father   . Alcohol abuse Brother   . COPD Maternal Grandmother   . Cancer Paternal Grandmother   . Stroke Paternal Grandfather   . Healthy Daughter   . Healthy Son        Current Outpatient Medications (Analgesics):  .  celecoxib (CELEBREX) 100 MG capsule, Take 1 capsule (100 mg total) by mouth 2 (two) times daily as needed.      Past medical history, social, surgical and family history all reviewed in electronic medical record.  No pertanent information unless stated regarding to the chief complaint.   Review of Systems:  No headache, visual changes, nausea, vomiting, diarrhea, constipation, dizziness, abdominal pain, skin rash, fevers, chills, night sweats, weight loss, swollen lymph nodes, body aches, joint swelling,  chest pain, shortness of  breath, mood changes.  Positive muscle aches  Objective  Blood pressure 96/70, pulse (!) 55, height 5\' 8"  (1.727 m), weight 137 lb (62.1 kg), SpO2 98 %. Systems examined below as of    General: No apparent distress alert and oriented x3 mood and affect normal, dressed appropriately.  HEENT: Pupils equal, extraocular movements intact  Respiratory: Patient's speak in full sentences and does not appear short of breath  Cardiovascular: No lower extremity edema, non tender, no erythema  Skin: Warm dry intact with no signs of infection or rash on extremities or on axial skeleton.  Abdomen: Soft nontender  Neuro: Cranial nerves II through XII are intact, neurovascularly intact in all extremities with 2+ DTRs and 2+ pulses.  Lymph: No lymphadenopathy of posterior or  anterior cervical chain or axillae bilaterally.  Gait antalgic MSK:  Non tender with full range of motion and good stability and symmetric strength and tone of shoulders, elbows, wrist,  knee and ankles bilaterally.  Left hip exam does have some limited range of motion.  Lacks last 10 degrees of internal rotation.  Full external rotation 5 degrees more than what would be anticipated.  Tightness of the hip flexor is severe.  Contralateral hip unremarkable  Osteopathic findings C6 flexed rotated and side bent left T6 extended rotated and side bent right inhaled rib T9 extended rotated and side bent left L1 flexed rotated and side bent right Sacrum left on left     Impression and Recommendations:     This case required medical decision making of moderate complexity. The above documentation has been reviewed and is accurate and complete Lyndal Pulley, DO       Note: This dictation was prepared with Dragon dictation along with smaller phrase technology. Any transcriptional errors that result from this process are unintentional.

## 2018-08-25 NOTE — Patient Instructions (Addendum)
Good to see you.  Calcium pyruvate  See me again in 4-6 weeks

## 2018-09-22 ENCOUNTER — Encounter: Payer: Self-pay | Admitting: Family Medicine

## 2018-09-22 ENCOUNTER — Ambulatory Visit (INDEPENDENT_AMBULATORY_CARE_PROVIDER_SITE_OTHER)
Admission: RE | Admit: 2018-09-22 | Discharge: 2018-09-22 | Disposition: A | Discharge status: Home / Self Care | Payer: Commercial Managed Care - PPO | Source: Ambulatory Visit | Attending: Family Medicine | Admitting: Family Medicine

## 2018-09-22 ENCOUNTER — Ambulatory Visit (INDEPENDENT_AMBULATORY_CARE_PROVIDER_SITE_OTHER): Payer: Commercial Managed Care - PPO | Admitting: Family Medicine

## 2018-09-22 ENCOUNTER — Ambulatory Visit: Payer: Self-pay

## 2018-09-22 ENCOUNTER — Other Ambulatory Visit: Payer: Self-pay

## 2018-09-22 ENCOUNTER — Other Ambulatory Visit (INDEPENDENT_AMBULATORY_CARE_PROVIDER_SITE_OTHER): Payer: Commercial Managed Care - PPO

## 2018-09-22 VITALS — BP 102/68 | HR 59 | Ht 68.0 in | Wt 140.0 lb

## 2018-09-22 DIAGNOSIS — M25552 Pain in left hip: Secondary | ICD-10-CM

## 2018-09-22 DIAGNOSIS — M24552 Contracture, left hip: Secondary | ICD-10-CM

## 2018-09-22 DIAGNOSIS — M999 Biomechanical lesion, unspecified: Secondary | ICD-10-CM | POA: Diagnosis not present

## 2018-09-22 DIAGNOSIS — M2550 Pain in unspecified joint: Secondary | ICD-10-CM | POA: Diagnosis not present

## 2018-09-22 LAB — CBC WITH DIFFERENTIAL/PLATELET
Basophils Absolute: 0 10*3/uL (ref 0.0–0.1)
Basophils Relative: 0.6 % (ref 0.0–3.0)
Eosinophils Absolute: 0.1 10*3/uL (ref 0.0–0.7)
Eosinophils Relative: 2.3 % (ref 0.0–5.0)
HCT: 38.6 % (ref 36.0–46.0)
Hemoglobin: 13.1 g/dL (ref 12.0–15.0)
Lymphocytes Relative: 31.4 % (ref 12.0–46.0)
Lymphs Abs: 1.7 10*3/uL (ref 0.7–4.0)
MCHC: 33.8 g/dL (ref 30.0–36.0)
MCV: 93.2 fl (ref 78.0–100.0)
Monocytes Absolute: 0.4 10*3/uL (ref 0.1–1.0)
Monocytes Relative: 6.6 % (ref 3.0–12.0)
Neutro Abs: 3.2 10*3/uL (ref 1.4–7.7)
Neutrophils Relative %: 59.1 % (ref 43.0–77.0)
Platelets: 234 10*3/uL (ref 150.0–400.0)
RBC: 4.15 Mil/uL (ref 3.87–5.11)
RDW: 12.9 % (ref 11.5–15.5)
WBC: 5.4 10*3/uL (ref 4.0–10.5)

## 2018-09-22 LAB — COMPREHENSIVE METABOLIC PANEL
ALT: 13 U/L (ref 0–35)
AST: 16 U/L (ref 0–37)
Albumin: 4.5 g/dL (ref 3.5–5.2)
Alkaline Phosphatase: 53 U/L (ref 39–117)
BUN: 14 mg/dL (ref 6–23)
CO2: 29 mEq/L (ref 19–32)
Calcium: 9.4 mg/dL (ref 8.4–10.5)
Chloride: 101 mEq/L (ref 96–112)
Creatinine, Ser: 0.96 mg/dL (ref 0.40–1.20)
GFR: 61.62 mL/min (ref >60.00)
Glucose, Bld: 89 mg/dL (ref 70–99)
Potassium: 4.4 mEq/L (ref 3.5–5.1)
Sodium: 139 mEq/L (ref 135–145)
Total Bilirubin: 1.7 mg/dL — ABNORMAL HIGH (ref 0.2–1.2)
Total Protein: 7 g/dL (ref 6.0–8.3)

## 2018-09-22 LAB — VITAMIN D 25 HYDROXY (VIT D DEFICIENCY, FRACTURES): VITD: 41.43 ng/mL (ref 30.00–100.00)

## 2018-09-22 LAB — FERRITIN: Ferritin: 29.1 ng/mL (ref 10.0–291.0)

## 2018-09-22 LAB — TSH: TSH: 1.65 u[IU]/mL (ref 0.35–4.50)

## 2018-09-22 LAB — IBC PANEL
Iron: 107 ug/dL (ref 42–145)
Saturation Ratios: 29.2 % (ref 20.0–50.0)
Transferrin: 262 mg/dL (ref 212.0–360.0)

## 2018-09-22 LAB — C-REACTIVE PROTEIN: CRP: <1 mg/dL (ref 0.5–20.0)

## 2018-09-22 LAB — SEDIMENTATION RATE: Sed Rate: 5 mm/hr (ref 0–20)

## 2018-09-22 LAB — URIC ACID: Uric Acid, Serum: 4.2 mg/dL (ref 2.4–7.0)

## 2018-09-22 NOTE — Patient Instructions (Addendum)
Xray downstairs  Labs as well See me in 3-4 weeks

## 2018-09-22 NOTE — Assessment & Plan Note (Signed)
Decision today to treat with OMT was based on Physical Exam  After verbal consent patient was treated with HVLA, ME, FPR techniques in  thoracic, lumbar and sacral areas  Patient tolerated the procedure well with improvement in symptoms  Patient given exercises, stretches and lifestyle modifications  See medications in patient instructions if given  Patient will follow up in 4-8 weeks 

## 2018-09-22 NOTE — Progress Notes (Signed)
[name] [address], [place] [address] Phone: [phone] Subjective:   Kelly [name], am serving as a scribe for Dr. Hulan Saas.  I'm seeing this patient by the request  of:    CC: Hip and back pain follow-up  [id number]   08/25/2018: Hip flexor tightness.  Discussed with patient in great length about icing regimen, home exercises, which activities to do which was to avoid.  We discussed posture and ergonomics.  Discussed which activities of doing which wants to avoid.  Attempted osteopathic manipulation.  Patient's MRI otherwise seems to be fairly unremarkable and I do not see anything that is truly surgical.  Follow Again in 4 weeks  Update 09/22/2018: Kelly [name] is a 49 y.o. female coming in with complaint of back pain. Patient states that she is having improvement. Continues to have pain over greater trochanter, left side. Bursitis pain is constant. Hip flexor pain is dull and constant but can flare up on occasion. Patient was rowing last weekend and felt an increase in her pain with that motion. Patient notes pain at night with standing from sitting. Pain is cramping in nature.  Patient still has significant tightness.  Seems to be more of the iliac crest on the left side now posteriorly.    [name] past medical history on file. [name] past surgical history on file. Social History   Socioeconomic History  . Marital status: Married    Spouse name: Not on file  . Number of children: Not on file  . Years of education: Not on file  . Highest education level: Not on file  Occupational History  . Not on file  Social Needs  . Financial resource strain: Not on file  . Food insecurity    Worry: Not on file    Inability: Not on file  . Transportation needs    Medical: Not on file    Non-medical: Not on file  Tobacco Use  . Smoking status: Never Smoker  . Smokeless tobacco: Never Used  Substance and Sexual Activity  . Alcohol use: Yes     Comment: beer or wine - seldom use  . Drug use: Never  . Sexual activity: Yes    Partners: Male    Birth control/protection: Post-menopausal  Lifestyle  . Physical activity    Days per week: Not on file    Minutes per session: Not on file  . Stress: Not on file  Relationships  . Social Herbalist on phone: Not on file    Gets together: Not on file    Attends religious service: Not on file    Active member of club or organization: Not on file    Attends meetings of clubs or organizations: Not on file    Relationship status: Not on file  Other Topics Concern  . Not on file  Social History Narrative  . Not on file   [name] Known Allergies Family History  Problem Relation Age of Onset  . Alcohol abuse Mother   . Osteoporosis Mother   . High blood pressure Father   . Alcohol abuse Brother   . COPD Maternal Grandmother   . Cancer Paternal Grandmother   . Stroke Paternal Grandfather   . Healthy Daughter   . Healthy Son        Current Outpatient Medications (Analgesics):  .  celecoxib (CELEBREX) 100 MG capsule, Take 1 capsule (100 mg total) by mouth 2 (two) times daily  as needed.      Past medical history, social, surgical and family history all reviewed in electronic medical record.  [name] pertanent information unless stated regarding to the chief complaint.   Review of Systems:  [name] headache, visual changes, nausea, vomiting, diarrhea, constipation, dizziness, abdominal pain, skin rash, fevers, chills, night sweats, weight loss, swollen lymph nodes, body aches, joint swelling, muscle aches, chest pain, shortness of breath, mood changes.   Objective  Blood pressure 102/68, pulse (!) 59, height 5\' 8"  (1.727 m), weight 140 lb (63.5 kg), SpO2 98 %. Systems examined below as of    General: [name] apparent distress alert and oriented x3 mood and affect normal, dressed appropriately.  HEENT: Pupils equal, extraocular movements intact  Respiratory: Patient's speak in full  sentences and does not appear short of breath  Cardiovascular: [name] lower extremity edema, non tender, [name] erythema  Skin: Warm dry intact with [name] signs of infection or rash on extremities or on axial skeleton.  Abdomen: Soft nontender  Neuro: Cranial nerves II through XII are intact, neurovascularly intact in all extremities with 2+ DTRs and 2+ pulses.  Lymph: [name] lymphadenopathy of posterior or anterior cervical chain or axillae bilaterally.  Gait mild antalgic with weakness of the left hip still noted..  MSK:  Non tender with full range of motion and good stability and symmetric strength and tone of shoulders, elbows, wrist, , knee and ankles bilaterally.   Back exam does have loss of lordosis.  Severe tightness of the hip flexors bilaterally left greater than right.  Positive Faber on the left side with decreased internal and external range of motion of the hips bilaterally.  Tightness with straight leg test but negative for radicular symptoms.  Osteopathic findings T9 extended rotated and side bent left L4 flexed rotated and side bent left  Sacrum left on left    Impression and Recommendations:     This case required medical decision making of moderate complexity. The above documentation has been reviewed and is accurate and complete Lyndal Pulley, DO       Note: This dictation was prepared with Dragon dictation along with smaller phrase technology. Any transcriptional errors that result from this process are unintentional.

## 2018-09-22 NOTE — Assessment & Plan Note (Signed)
Still most likely hip flexor tightness.  Due to the longevity and patient continued to have difficulty x-rays of the back ordered today as well.  Patient has had intermittent difficulties with this during her life and I would like to get laboratory work-up to rule out any type of autoimmune disease that could be contributing.  Discussed icing regimen and home exercises.  Discussed core strengthening again.  Encourage patient to continue with formal physical therapy.  Responded well to manipulation and follow-up again in 4 to 8 weeks

## 2018-09-23 LAB — CYCLIC CITRUL PEPTIDE ANTIBODY, IGG: Cyclic Citrullin Peptide Ab: <16 UNITS

## 2018-09-23 LAB — ANA: Anti Nuclear Antibody (ANA): NEGATIVE

## 2018-09-23 LAB — PTH, INTACT AND CALCIUM
Calcium: 9.7 mg/dL (ref 8.6–10.2)
PTH: 25 pg/mL (ref 14–64)

## 2018-09-23 LAB — RHEUMATOID FACTOR: Rheumatoid fact SerPl-aCnc: <14 IU/mL (ref <14)

## 2018-09-23 LAB — CALCIUM, IONIZED: Calcium, Ion: 5.09 mg/dL (ref 4.8–5.6)

## 2018-09-23 LAB — ANGIOTENSIN CONVERTING ENZYME: Angiotensin-Converting Enzyme: 15 U/L (ref 9–67)

## 2018-11-02 NOTE — Progress Notes (Signed)
[name] [place], [place] [address] Phone: [phone] Subjective:   [name], am serving as a scribe for Dr. Hulan Saas.  I'm seeing this patient by the request  of:    CC:    [id number]   09/22/2018 Still most likely hip flexor tightness.  Due to the longevity and patient continued to have difficulty x-rays of the back ordered today as well.  Patient has had intermittent difficulties with this during her life and I would like to get laboratory work-up to rule out any type of autoimmune disease that could be contributing.  Discussed icing regimen and home exercises.  Discussed core strengthening again.  Encourage patient to continue with formal physical therapy.  Responded well to manipulation and follow-up again in 4 to 8 weeks  Update 11/03/2018 Kelly [name] is a 48 y.o. female coming in with complaint of back and hip pain. Patient states that she has been stretching. Occasional pain but is happy with her progress. Feels she is at 85-90% improvement.    No past medical history on file. No past surgical history on file. Social History   Socioeconomic History  . Marital status: Married    Spouse name: Not on file  . Number of children: Not on file  . Years of education: Not on file  . Highest education level: Not on file  Occupational History  . Not on file  Social Needs  . Financial resource strain: Not on file  . Food insecurity    Worry: Not on file    Inability: Not on file  . Transportation needs    Medical: Not on file    Non-medical: Not on file  Tobacco Use  . Smoking status: Never Smoker  . Smokeless tobacco: Never Used  Substance and Sexual Activity  . Alcohol use: Yes    Comment: beer or wine - seldom use  . Drug use: Never  . Sexual activity: Yes    Partners: Male    Birth control/protection: Post-menopausal  Lifestyle  . Physical activity    Days per week: Not on file    Minutes per session:  Not on file  . Stress: Not on file  Relationships  . Social Herbalist on phone: Not on file    Gets together: Not on file    Attends religious service: Not on file    Active member of club or organization: Not on file    Attends meetings of clubs or organizations: Not on file    Relationship status: Not on file  Other Topics Concern  . Not on file  Social History Narrative  . Not on file   No Known Allergies Family History  Problem Relation Age of Onset  . Alcohol abuse Mother   . Osteoporosis Mother   . High blood pressure Father   . Alcohol abuse Brother   . COPD Maternal Grandmother   . Cancer Paternal Grandmother   . Stroke Paternal Grandfather   . Healthy Daughter   . Healthy Son        Current Outpatient Medications (Analgesics):  .  celecoxib (CELEBREX) 100 MG capsule, Take 1 capsule (100 mg total) by mouth 2 (two) times daily as needed.      Past medical history, social, surgical and family history all reviewed in electronic medical record.  No pertanent information unless stated regarding to the chief complaint.   Review of Systems:  No headache, visual  changes, nausea, vomiting, diarrhea, constipation, dizziness, abdominal pain, skin rash, fevers, chills, night sweats, weight loss, swollen lymph nodes, body aches, joint swelling, muscle aches, chest pain, shortness of breath, mood changes.   Objective  There were no vitals taken for this visit. Systems examined below as of    General: No apparent distress alert and oriented x3 mood and affect normal, dressed appropriately.  HEENT: Pupils equal, extraocular movements intact  Respiratory: Patient's speak in full sentences and does not appear short of breath  Cardiovascular: No lower extremity edema, non tender, no erythema  Skin: Warm dry intact with no signs of infection or rash on extremities or on axial skeleton.  Abdomen: Soft nontender  Neuro: Cranial nerves II through XII are intact,  neurovascularly intact in all extremities with 2+ DTRs and 2+ pulses.  Lymph: No lymphadenopathy of posterior or anterior cervical chain or axillae bilaterally.  Gait normal with good balance and coordination.  MSK:  Non tender with full range of motion and good stability and symmetric strength and tone of shoulders, elbows, wrist,  knee and ankles bilaterally.  Back exam still shows some mild tightness of the hip flexors bilaterally left greater than right.  Mild pain with Corky Sox test more on the medial aspect of the hip joint and none on the lateral.  Patient has a negative straight leg test.  Nontender on the paraspinal musculature lumbar spine.  Near full range of motion.  5-5 strength of both lower extremities.  Osteopathic findings T8 extended rotated and side bent left L2 flexed rotated and side bent right Sacrum left on left    Impression and Recommendations:     This case required medical decision making of moderate complexity. The above documentation has been reviewed and is accurate and complete Lyndal Pulley, DO       Note: This dictation was prepared with Dragon dictation along with smaller phrase technology. Any transcriptional errors that result from this process are unintentional.

## 2018-11-03 ENCOUNTER — Other Ambulatory Visit: Payer: Self-pay

## 2018-11-03 ENCOUNTER — Encounter: Payer: Self-pay | Admitting: Family Medicine

## 2018-11-03 ENCOUNTER — Ambulatory Visit (INDEPENDENT_AMBULATORY_CARE_PROVIDER_SITE_OTHER): Payer: Commercial Managed Care - PPO | Admitting: Family Medicine

## 2018-11-03 VITALS — BP 118/72 | HR 68 | Ht 68.0 in | Wt 142.0 lb

## 2018-11-03 DIAGNOSIS — M24552 Contracture, left hip: Secondary | ICD-10-CM

## 2018-11-03 DIAGNOSIS — M999 Biomechanical lesion, unspecified: Secondary | ICD-10-CM

## 2018-11-03 NOTE — Patient Instructions (Addendum)
Good to see you Keep up the good work See me again in 2 months

## 2018-11-03 NOTE — Assessment & Plan Note (Signed)
Patient is some tightness in the left hip flexor.  We discussed with patient in great length about icing regimen and home exercises.  Discussed avoiding certain activities.  Patient is to increase activity slowly over the course the next several weeks.  Patient will follow-up with me again in 2 months.

## 2018-11-03 NOTE — Assessment & Plan Note (Signed)
Decision today to treat with OMT was based on Physical Exam  After verbal consent patient was treated with HVLA, ME, FPR techniques in  thoracic, lumbar and sacral areas  Patient tolerated the procedure well with improvement in symptoms  Patient given exercises, stretches and lifestyle modifications  See medications in patient instructions if given  Patient will follow up in 8 weeks

## 2018-11-12 ENCOUNTER — Encounter: Payer: Self-pay | Admitting: Family Medicine

## 2019-01-19 ENCOUNTER — Encounter: Payer: Self-pay | Admitting: Family Medicine

## 2019-01-19 ENCOUNTER — Ambulatory Visit (INDEPENDENT_AMBULATORY_CARE_PROVIDER_SITE_OTHER): Payer: Self-pay | Admitting: Family Medicine

## 2019-01-19 VITALS — BP 100/64 | HR 61 | Ht 68.0 in

## 2019-01-19 DIAGNOSIS — M999 Biomechanical lesion, unspecified: Secondary | ICD-10-CM

## 2019-01-19 DIAGNOSIS — M24552 Contracture, left hip: Secondary | ICD-10-CM

## 2019-01-19 NOTE — Assessment & Plan Note (Signed)
Much improved. Discussed HEP, discussed which activities to do and which ones to avoid.  RTC in 2-3 months

## 2019-01-19 NOTE — Patient Instructions (Signed)
See me again in 10weeks 

## 2019-01-19 NOTE — Progress Notes (Signed)
[name] [employer], [place] [address] Phone: [phone] Subjective:   I Kelly [name] am serving as a Education administrator for Dr. Hulan Saas.   CC: Low back pain  [id number]    Update 01/19/2019 Kelly [name] is a 48 y.o. female coming in with complaint of back pain. Patient states she is doing well today.  Patient has been working on a more regular basis.  Feels like she has made significant progress.  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  Patient states that she has not felt this good in quite some time.    No past medical history on file. No past surgical history on file. Social History   Socioeconomic History  . Marital status: Married    Spouse name: Not on file  . Number of children: Not on file  . Years of education: Not on file  . Highest education level: Not on file  Occupational History  . Not on file  Social Needs  . Financial resource strain: Not on file  . Food insecurity    Worry: Not on file    Inability: Not on file  . Transportation needs    Medical: Not on file    Non-medical: Not on file  Tobacco Use  . Smoking status: Never Smoker  . Smokeless tobacco: Never Used  Substance and Sexual Activity  . Alcohol use: Yes    Comment: beer or wine - seldom use  . Drug use: Never  . Sexual activity: Yes    Partners: Male    Birth control/protection: Post-menopausal  Lifestyle  . Physical activity    Days per week: Not on file    Minutes per session: Not on file  . Stress: Not on file  Relationships  . Social Herbalist on phone: Not on file    Gets together: Not on file    Attends religious service: Not on file    Active member of club or organization: Not on file    Attends meetings of clubs or organizations: Not on file    Relationship status: Not on file  Other Topics Concern  . Not on file  Social History Narrative  . Not on file   No Known Allergies Family History  Problem Relation Age of  Onset  . Alcohol abuse Mother   . Osteoporosis Mother   . High blood pressure Father   . Alcohol abuse Brother   . COPD Maternal Grandmother   . Cancer Paternal Grandmother   . Stroke Paternal Grandfather   . Healthy Daughter   . Healthy Son        Current Outpatient Medications (Analgesics):  .  celecoxib (CELEBREX) 100 MG capsule, Take 1 capsule (100 mg total) by mouth 2 (two) times daily as needed.      Past medical history, social, surgical and family history all reviewed in electronic medical record.  No pertanent information unless stated regarding to the chief complaint.   Review of Systems:  No headache, visual changes, nausea, vomiting, diarrhea, constipation, dizziness, abdominal pain, skin rash, fevers, chills, night sweats, weight loss, swollen lymph nodes, body aches, joint swelling, muscle aches, chest pain, shortness of breath, mood changes.   Objective  Blood pressure 100/64, pulse 61, height 5\' 8"  (1.727 m), SpO2 98 %.    General: No apparent distress alert and oriented x3 mood and affect normal, dressed appropriately.  HEENT: Pupils equal, extraocular movements intact  Respiratory: Patient's  speak in full sentences and does not appear short of breath  Cardiovascular: No lower extremity edema, non tender, no erythema  Skin: Warm dry intact with no signs of infection or rash on extremities or on axial skeleton.  Abdomen: Soft nontender  Neuro: Cranial nerves II through XII are intact, neurovascularly intact in all extremities with 2+ DTRs and 2+ pulses.  Lymph: No lymphadenopathy of posterior or anterior cervical chain or axillae bilaterally.  Gait normal with good balance and coordination.  MSK:  Non tender with full range of motion and good stability and symmetric strength and tone of shoulders, elbows, wrist, hip, knee and ankles bilaterally.  Back exam does have some loss of lordosis.  Some mild increased discomfort of 5 to 10 degrees in extension.  Mild  positive FABER test on the right side.  Negative straight leg test.  5-5 strength of the lower extremities.  Improvement in core strength.  Osteopathic findings  T11 extended rotated and side bent left L1 flexed rotated and side bent right L5 flexed rotated and side bent left  sacrum right on right    Impression and Recommendations:     This case required medical decision making of moderate complexity. The above documentation has been reviewed and is accurate and complete Lyndal Pulley, DO       Note: This dictation was prepared with Dragon dictation along with smaller phrase technology. Any transcriptional errors that result from this process are unintentional.

## 2019-01-19 NOTE — Assessment & Plan Note (Signed)
Decision today to treat with OMT was based on Physical Exam  After verbal consent patient was treated with HVLA, ME, FPR techniques in  thoracic, lumbar and sacral areas  Patient tolerated the procedure well with improvement in symptoms  Patient given exercises, stretches and lifestyle modifications  See medications in patient instructions if given  Patient will follow up in 4-8 weeks 

## 2019-01-29 ENCOUNTER — Encounter: Payer: Self-pay | Admitting: Family Medicine

## 2019-01-29 NOTE — Telephone Encounter (Signed)
Offer her appt with me or referral. Either is fine with me. Thanks.

## 2019-02-04 ENCOUNTER — Other Ambulatory Visit: Payer: Self-pay

## 2019-02-05 ENCOUNTER — Encounter: Payer: Self-pay | Admitting: Family Medicine

## 2019-02-05 ENCOUNTER — Ambulatory Visit (INDEPENDENT_AMBULATORY_CARE_PROVIDER_SITE_OTHER): Payer: Commercial Managed Care - PPO | Admitting: Family Medicine

## 2019-02-05 VITALS — BP 118/68 | HR 93 | Temp 98.1°F | Ht 68.0 in | Wt 146.0 lb

## 2019-02-05 DIAGNOSIS — L600 Ingrowing nail: Secondary | ICD-10-CM

## 2019-02-05 NOTE — Patient Instructions (Signed)
Please follow up as scheduled for your next visit with me: 04/06/2019   If you have any questions or concerns, please don't hesitate to send me a message via MyChart or call the office at [phone]. Thank you for visiting with [place] today! It's our pleasure caring for you.   Fingernail or Toenail Removal, Adult, Care After This sheet gives you information about how to care for yourself after your procedure. Your health care provider may also give you more specific instructions. If you have problems or questions, contact your health care provider. What can I expect after the procedure? After the procedure, it is common to have:  Pain.  Redness.  Swelling.  Soreness. Follow these instructions at home:  If you have a splint: ? Do not put pressure on any part of the splint until it is fully hardened. This may take several hours. ? Wear the splint as told by your health care provider. Remove it only as told by your health care provider. ? Loosen the splint if your fingers or toes tingle, become numb, or turn cold and blue. ? Keep the splint clean. ? If the splint is not waterproof:  Do not let it get wet.  Cover it with a watertight covering when you take a bath or a shower. Wound care   Follow instructions from your health care provider about how to take care of your wound. Make sure you: ? Wash your hands with soap and water before you change your bandage (dressing). If soap and water are not available, use hand sanitizer. ? Change your dressing as told by your health care provider. ? Keep your dressing dry until your health care provider says it can be removed. ? Leave stitches (sutures), skin glue, or adhesive strips in place. These skin closures may need to stay in place for 2 weeks or longer. If adhesive strip edges start to loosen and curl up, you may trim the loose edges. Do not remove adhesive strips completely unless your health care provider tells you to do that.  Check  your wound every day for signs of infection. Check for: ? More redness, swelling, or pain. ? More fluid or blood. ? Warmth. ? Pus or a bad smell. Managing pain, stiffness, and swelling  Move your fingers or toes often to avoid stiffness and to lessen swelling.  Raise (elevate) the injured area above the level of your heart while you are sitting or lying down. You may need to keep your finger or toe raised or supported on a pillow for 24 hours or as told by your health care provider.  Soak your hand or foot in warm, soapy water for 10-20 minutes, 3 times a day or as told by your health care provider. Medicine  Take over-the-counter and prescription medicines only as told by your health care provider.  If you were prescribed an antibiotic medicine, use it as told by your health care provider. Do not stop using the antibiotic even if your condition improves. General instructions  If you were given a shoe to wear, wear it as told by your health care provider.  Keep all follow-up visits as told by your health care provider. This is important. Contact a health care provider if:  You have more redness, swelling, or pain around your wound.  You have more fluid or blood coming from your wound.  Your wound feels warm to the touch.  You have pus or a bad smell coming from your wound.  You  have a fever.  Your finger or toe looks blue or black. This information is not intended to replace advice given to you by your health care provider. Make sure you discuss any questions you have with your health care provider. Document Released: 03/25/2014 Document Revised: 02/14/2017 Document Reviewed: 09/11/2015 Elsevier Patient Education  2020 [place] An ingrown toenail occurs when the corner or sides of a toenail grow into the surrounding skin. This causes discomfort and pain. The big toe is most commonly affected, but any of the toes can be affected. If an ingrown toenail is  not treated, it can become infected. What are the causes? This condition may be caused by:  Wearing shoes that are too small or tight.  An injury, such as stubbing your toe or having your toe stepped on.  Improper cutting or care of your toenails.  Having nail or foot abnormalities that were present from birth (congenital abnormalities), such as having a nail that is too big for your toe. What increases the risk? The following factors may make you more likely to develop ingrown toenails:  Age. Nails tend to get thicker with age, so ingrown nails are more common among older people.  Cutting your toenails incorrectly, such as cutting them very short or cutting them unevenly. An ingrown toenail is more likely to get infected if you have:  Diabetes.  Blood flow (circulation) problems. What are the signs or symptoms? Symptoms of an ingrown toenail may include:  Pain, soreness, or tenderness.  Redness.  Swelling.  Hardening of the skin that surrounds the toenail. Signs that an ingrown toenail may be infected include:  Fluid or pus.  Symptoms that get worse instead of better. How is this diagnosed? An ingrown toenail may be diagnosed based on your medical history, your symptoms, and a physical exam. If you have fluid or blood coming from your toenail, a sample may be collected to test for the specific type of bacteria that is causing the infection. How is this treated? Treatment depends on how severe your ingrown toenail is. You may be able to care for your toenail at home.  If you have an infection, you may be prescribed antibiotic medicines.  If you have fluid or pus draining from your toenail, your health care provider may drain it.  If you have trouble walking, you may be given crutches to use.  If you have a severe or infected ingrown toenail, you may need a procedure to remove part or all of the nail. Follow these instructions at home: Foot care   Do not pick at  your toenail or try to remove it yourself.  Soak your foot in warm, soapy water. Do this for 20 minutes, 3 times a day, or as often as told by your health care provider. This helps to keep your toe clean and keep your skin soft.  Wear shoes that fit well and are not too tight. Your health care provider may recommend that you wear open-toed shoes while you heal.  Trim your toenails regularly and carefully. Cut your toenails straight across to prevent injury to the skin at the corners of the toenail. Do not cut your nails in a curved shape.  Keep your feet clean and dry to help prevent infection. Medicines  Take over-the-counter and prescription medicines only as told by your health care provider.  If you were prescribed an antibiotic, take it as told by your health care provider. Do not stop taking the  antibiotic even if you start to feel better. Activity  Return to your normal activities as told by your health care provider. Ask your health care provider what activities are safe for you.  Avoid activities that cause pain. General instructions  If your health care provider told you to use crutches to help you move around, use them as instructed.  Keep all follow-up visits as told by your health care provider. This is important. Contact a health care provider if:  You have more redness, swelling, pain, or other symptoms that do not improve with treatment.  You have fluid, blood, or pus coming from your toenail. Get help right away if:  You have a red streak on your skin that starts at your foot and spreads up your leg.  You have a fever. Summary  An ingrown toenail occurs when the corner or sides of a toenail grow into the surrounding skin. This causes discomfort and pain. The big toe is most commonly affected, but any of the toes can be affected.  If an ingrown toenail is not treated, it can become infected.  Fluid or pus draining from your toenail is a sign of infection. Your  health care provider may need to drain it. You may be given antibiotics to treat the infection.  Trimming your toenails regularly and properly can help you prevent an ingrown toenail. This information is not intended to replace advice given to you by your health care provider. Make sure you discuss any questions you have with your health care provider. Document Released: 03/01/2000 Document Revised: 06/26/2018 Document Reviewed: 11/20/2016 Elsevier Patient Education  2020 [employer].

## 2019-02-05 NOTE — Progress Notes (Signed)
Subjective  CC:  Chief Complaint  Patient presents with  . Ingrown Toenail    Left great toe    HPI: Kelly [name] is a 48 y.o. female who presents to the office today to address the problems listed above in the chief complaint.  48 yo runner with medial ingrown toenail on left foot. Has been bothering her for several months; now only wearing open toe shoes; if wears any other shoes, the medial edge of great toe becomes red, swollen and painful. She has not had drainage or pus. Redness has completely resolved but still tender if pressure is applied over nail. Had similar problem on right foot many years ago.    Assessment  1. Ingrown toenail of left foot      Plan   Ingrown painful toenail, left great toe, w/o infection:  Educated. S/p wedge resection of nail. Routine post procedure care instructions were given to patient in detail.   Follow up: Return for as scheduled.  04/06/2019  No orders of the defined types were placed in this encounter.  No orders of the defined types were placed in this encounter.     I reviewed the patients updated PMH, FH, and SocHx.    Patient Active Problem List   Diagnosis Date Noted  . Hip flexor tightness, left 08/25/2018  . Nonallopathic lesion of sacral region 08/25/2018  . Nonallopathic lesion of lumbosacral region 08/25/2018  . Nonallopathic lesion of thoracic region 08/25/2018  . Left hip pain 05/22/2018   Current Meds  Medication Sig  . celecoxib (CELEBREX) 100 MG capsule Take 1 capsule (100 mg total) by mouth 2 (two) times daily as needed.    Allergies: Patient has No Known Allergies. Family History: Patient family history includes Alcohol abuse in her brother and mother; COPD in her maternal grandmother; Cancer in her paternal grandmother; Healthy in her daughter and son; High blood pressure in her father; Osteoporosis in her mother; Stroke in her paternal grandfather. Social History:  Patient  reports that she has never  smoked. She has never used smokeless tobacco. She reports current alcohol use. She reports that she does not use drugs.  Review of Systems: Constitutional: Negative for fever malaise or anorexia Cardiovascular: negative for chest pain Respiratory: negative for SOB or persistent cough Gastrointestinal: negative for abdominal pain  Objective  Vitals: BP 118/68 (BP Location: Left Arm, Patient Position: Sitting, Cuff Size: Normal)   Pulse 93   Temp 98.1 F (36.7 C) (Temporal)   Ht 5\' 8"  (1.727 m)   Wt 146 lb (66.2 kg)   SpO2 98%   BMI 22.20 kg/m  General: no acute distress , A&Ox3 Left Medial toenail is ingrown and tender with pressure; no redness or swelling or pus present Skin:  Warm, no rashes  Wedge Resection of Toe Nail PROCEDURE NOTE   left Ingrown nail, no infection.   Patient given informed consent, signed copy in the chart. Appropriate time out taken.    Area prepped and draped in usual sterile fashion. Digital block done using 2% lidocaine without epinephrine, 3cc total. Medial border of the nail was isolated and elevated by using nail elevator, then incised the nail longitudinally through it's entire length with a scissors. Wedge section removed using hemostats and traction force. No complications.  Minimal bleeding.  Sterile bandage applied and post procedure instructions given.  Patient tolerated procedure well without complications.     Commons side effects, risks, benefits, and alternatives for medications and treatment plan prescribed today  were discussed, and the patient expressed understanding of the given instructions. Patient is instructed to call or message via MyChart if he/she has any questions or concerns regarding our treatment plan. No barriers to understanding were identified. We discussed Red Flag symptoms and signs in detail. Patient expressed understanding regarding what to do in case of urgent or emergency type symptoms.   Medication list was reconciled,  printed and provided to the patient in AVS. Patient instructions and summary information was reviewed with the patient as documented in the AVS. This note was prepared with assistance of Dragon voice recognition software. Occasional wrong-word or sound-a-like substitutions may have occurred due to the inherent limitations of voice recognition software

## 2019-03-30 ENCOUNTER — Encounter: Payer: Self-pay | Admitting: Family Medicine

## 2019-03-30 ENCOUNTER — Ambulatory Visit: Payer: Commercial Managed Care - PPO | Admitting: Family Medicine

## 2019-03-30 ENCOUNTER — Other Ambulatory Visit: Payer: Self-pay

## 2019-03-30 VITALS — BP 110/70 | HR 63 | Ht 68.0 in | Wt 149.0 lb

## 2019-03-30 DIAGNOSIS — M999 Biomechanical lesion, unspecified: Secondary | ICD-10-CM | POA: Diagnosis not present

## 2019-03-30 DIAGNOSIS — M24552 Contracture, left hip: Secondary | ICD-10-CM

## 2019-03-30 NOTE — Patient Instructions (Signed)
If worse 1 pill 3 times a day Continue the exercises  See me again in 4 weeks

## 2019-03-30 NOTE — Assessment & Plan Note (Signed)
Patient has significant tightness more on the left side again.  We discussed the possibility of anti-inflammatories for short course.  Discussed icing regimen and home exercises.  Has taken Celebrex before we could do a short course of Cox 1 and 2 inhibitors.  Topical anti-inflammatories as discussed and over-the-counter medications.  Patient will remain active.  Follow-up again 4 weeks

## 2019-03-30 NOTE — Assessment & Plan Note (Signed)
Decision today to treat with OMT was based on Physical Exam  After verbal consent patient was treated with HVLA, ME, FPR techniques in  thoracic, lumbar and sacral areas  Patient tolerated the procedure well with improvement in symptoms  Patient given exercises, stretches and lifestyle modifications  See medications in patient instructions if given  Patient will follow up in 4 weeks 

## 2019-03-30 NOTE — Progress Notes (Signed)
[facility] [address] [place] Phone: [phone] Subjective:   I Kelly [name] am serving as a Education administrator for Dr. Hulan Saas.  This visit occurred during the SARS-CoV-2 public health emergency.  Safety protocols were in place, including screening questions prior to the visit, additional usage of staff PPE, and extensive cleaning of exam room while observing appropriate contact time as indicated for disinfecting solutions.    CC: Low back pain follow-up  [id number]  Kelly [name] is a 49 y.o. female coming in with complaint of back pain. Patient states she was doing fine until she climbed the [facility].  Patient did do a significant amount of hiking.  Did 27 miles in 2 days.  Patient was the initial person to carry everything.  Discussed icing regimen which she has done intermittently.  Has had difficulty working out for the last 2 weeks and is having more pain at night.     No past medical history on file. No past surgical history on file. Social History   Socioeconomic History  . Marital status: Married    Spouse name: Not on file  . Number of children: Not on file  . Years of education: Not on file  . Highest education level: Not on file  Occupational History  . Not on file  Tobacco Use  . Smoking status: Never Smoker  . Smokeless tobacco: Never Used  Substance and Sexual Activity  . Alcohol use: Yes    Comment: beer or wine - seldom use  . Drug use: Never  . Sexual activity: Yes    Partners: Male    Birth control/protection: Post-menopausal  Other Topics Concern  . Not on file  Social History Narrative  . Not on file   Social Determinants of Health   Financial Resource Strain:   . Difficulty of Paying Living Expenses: Not on file  Food Insecurity:   . Worried About Charity fundraiser in the Last Year: Not on file  . Ran Out of Food in the Last Year: Not on file  Transportation Needs:   . Lack of  Transportation (Medical): Not on file  . Lack of Transportation (Non-Medical): Not on file  Physical Activity:   . Days of Exercise per Week: Not on file  . Minutes of Exercise per Session: Not on file  Stress:   . Feeling of Stress : Not on file  Social Connections:   . Frequency of Communication with Friends and Family: Not on file  . Frequency of Social Gatherings with Friends and Family: Not on file  . Attends Religious Services: Not on file  . Active Member of Clubs or Organizations: Not on file  . Attends Archivist Meetings: Not on file  . Marital Status: Not on file   No Known Allergies Family History  Problem Relation Age of Onset  . Alcohol abuse Mother   . Osteoporosis Mother   . High blood pressure Father   . Alcohol abuse Brother   . COPD Maternal Grandmother   . Cancer Paternal Grandmother   . Stroke Paternal Grandfather   . Healthy Daughter   . Healthy Son        Current Outpatient Medications (Analgesics):  .  celecoxib (CELEBREX) 100 MG capsule, Take 1 capsule (100 mg total) by mouth 2 (two) times daily as needed.      Past medical history, social, surgical and family history all reviewed in electronic medical record.  No  pertanent information unless stated regarding to the chief complaint.   Review of Systems:  No headache, visual changes, nausea, vomiting, diarrhea, constipation, dizziness, abdominal pain, skin rash, fevers, chills, night sweats, weight loss, swollen lymph nodes, body aches, joint swelling, muscle aches, chest pain, shortness of breath, mood changes.   Objective  Blood pressure 110/70, pulse 63, height 5\' 8"  (1.727 m), weight 149 lb (67.6 kg), SpO2 98 %.    General: No apparent distress alert and oriented x3 mood and affect normal, dressed appropriately.  HEENT: Pupils equal, extraocular movements intact  Respiratory: Patient's speak in full sentences and does not appear short of breath  Cardiovascular: No lower extremity  edema, non tender, no erythema  Skin: Warm dry intact with no signs of infection or rash on extremities or on axial skeleton.  Abdomen: Soft nontender  Neuro: Cranial nerves II through XII are intact, neurovascularly intact in all extremities with 2+ DTRs and 2+ pulses.  Lymph: No lymphadenopathy of posterior or anterior cervical chain or axillae bilaterally.  Gait normal with good balance and coordination.  MSK:  Non tender with full range of motion and good stability and symmetric strength and tone of shoulders, elbows, wrist, hip, knee and ankles bilaterally.  Back exam has significant tightness of the hip flexors bilaterally left greater than right.  Patient has mild tightness even with Corky Sox test bilaterally.  Negative straight leg test.  Moderate to severe tenderness to palpation in the paraspinal musculature lumbar spine left greater than right.   Osteopathic findings T11 extended rotated and side bent left L2 flexed rotated and side bent right Sacrum left on left    Impression and Recommendations:     This case required medical decision making of moderate complexity. The above documentation has been reviewed and is accurate and complete Lyndal Pulley, DO       Note: This dictation was prepared with Dragon dictation along with smaller phrase technology. Any transcriptional errors that result from this process are unintentional.

## 2019-04-06 ENCOUNTER — Encounter: Payer: Commercial Managed Care - PPO | Admitting: Family Medicine

## 2019-04-09 ENCOUNTER — Ambulatory Visit (INDEPENDENT_AMBULATORY_CARE_PROVIDER_SITE_OTHER): Payer: Commercial Managed Care - PPO | Admitting: Family Medicine

## 2019-04-09 ENCOUNTER — Encounter: Payer: Self-pay | Admitting: Family Medicine

## 2019-04-09 ENCOUNTER — Other Ambulatory Visit: Payer: Self-pay

## 2019-04-09 VITALS — BP 116/68 | HR 99 | Temp 97.7°F | Ht 68.0 in | Wt 146.8 lb

## 2019-04-09 DIAGNOSIS — Z1211 Encounter for screening for malignant neoplasm of colon: Secondary | ICD-10-CM

## 2019-04-09 DIAGNOSIS — Z23 Encounter for immunization: Secondary | ICD-10-CM

## 2019-04-09 DIAGNOSIS — Z0000 Encounter for general adult medical examination without abnormal findings: Secondary | ICD-10-CM | POA: Diagnosis not present

## 2019-04-09 DIAGNOSIS — Z8262 Family history of osteoporosis: Secondary | ICD-10-CM | POA: Diagnosis not present

## 2019-04-09 DIAGNOSIS — Z1212 Encounter for screening for malignant neoplasm of rectum: Secondary | ICD-10-CM

## 2019-04-09 HISTORY — DX: Family history of osteoporosis: Z82.62

## 2019-04-09 LAB — CBC WITH DIFFERENTIAL/PLATELET
Basophils Absolute: 0 10*3/uL (ref 0.0–0.1)
Basophils Relative: 0.7 % (ref 0.0–3.0)
Eosinophils Absolute: 0.1 10*3/uL (ref 0.0–0.7)
Eosinophils Relative: 2.4 % (ref 0.0–5.0)
HCT: 40.4 % (ref 36.0–46.0)
Hemoglobin: 13.6 g/dL (ref 12.0–15.0)
Lymphocytes Relative: 29.2 % (ref 12.0–46.0)
Lymphs Abs: 1.7 10*3/uL (ref 0.7–4.0)
MCHC: 33.8 g/dL (ref 30.0–36.0)
MCV: 91.2 fl (ref 78.0–100.0)
Monocytes Absolute: 0.5 10*3/uL (ref 0.1–1.0)
Monocytes Relative: 8.7 % (ref 3.0–12.0)
Neutro Abs: 3.5 10*3/uL (ref 1.4–7.7)
Neutrophils Relative %: 59 % (ref 43.0–77.0)
Platelets: 269 10*3/uL (ref 150.0–400.0)
RBC: 4.43 Mil/uL (ref 3.87–5.11)
RDW: 12.9 % (ref 11.5–15.5)
WBC: 5.9 10*3/uL (ref 4.0–10.5)

## 2019-04-09 LAB — LIPID PANEL
Cholesterol: 192 mg/dL (ref 0–200)
HDL: 86.5 mg/dL (ref >39.00)
LDL Cholesterol: 95 mg/dL (ref 0–99)
NonHDL: 105.31
Total CHOL/HDL Ratio: 2
Triglycerides: 54 mg/dL (ref 0.0–149.0)
VLDL: 10.8 mg/dL (ref 0.0–40.0)

## 2019-04-09 LAB — COMPREHENSIVE METABOLIC PANEL
ALT: 12 U/L (ref 0–35)
AST: 17 U/L (ref 0–37)
Albumin: 4.4 g/dL (ref 3.5–5.2)
Alkaline Phosphatase: 52 U/L (ref 39–117)
BUN: 16 mg/dL (ref 6–23)
CO2: 29 mEq/L (ref 19–32)
Calcium: 9.6 mg/dL (ref 8.4–10.5)
Chloride: 102 mEq/L (ref 96–112)
Creatinine, Ser: 0.97 mg/dL (ref 0.40–1.20)
GFR: 60.75 mL/min (ref >60.00)
Glucose, Bld: 91 mg/dL (ref 70–99)
Potassium: 4.3 mEq/L (ref 3.5–5.1)
Sodium: 140 mEq/L (ref 135–145)
Total Bilirubin: 1 mg/dL (ref 0.2–1.2)
Total Protein: 6.9 g/dL (ref 6.0–8.3)

## 2019-04-09 NOTE — Addendum Note (Signed)
Addended bySerita Sheller on: 04/09/2019 11:58 AM   Modules accepted: Orders

## 2019-04-09 NOTE — Addendum Note (Signed)
Addended bySerita Sheller on: 04/09/2019 11:55 AM   Modules accepted: Orders

## 2019-04-09 NOTE — Progress Notes (Signed)
Subjective  Chief Complaint  Patient presents with  . Annual Exam    HPI: Kelly [name] is a 49 y.o. female who presents to [facility] today for a Female Wellness Visit.   Wellness Visit: annual visit with health maintenance review and exam without Pap   Healthy 2 female for annual exam.  Doing well.  Continues to have treatments due to hip pain by sports medicine.  This has improved over time.  Was recently exacerbated when she was at the [facility].  Due for mammogram next year.  Due for colorectal cancer screening, she is average risk patient.  Assessment  1. Annual physical exam   2. Screening for colorectal cancer   3. Family history of osteoporosis in mother      Plan  Female Wellness Visit:  Age appropriate Health Maintenance and Prevention measures were discussed with patient. Included topics are cancer screening recommendations, ways to keep healthy (see AVS) including dietary and exercise recommendations, regular eye and dental care, use of seat belts, and avoidance of moderate alcohol use and tobacco use.  Cologuard recommended for colorectal cancer screening.  Patient declines colonoscopy at this time  BMI: discussed patient's BMI and encouraged positive lifestyle modifications to help get to or maintain a target BMI.  HM needs and immunizations were addressed and ordered. See below for orders. See HM and immunization section for updates.  Flu shot today.  She will take the Covid vaccination once available  Routine labs and screening tests ordered including cmp, cbc and lipids where appropriate.  Discussed recommendations regarding Vit D and calcium supplementation (see AVS)  Follow up: 1 year for complete physical  Orders Placed This Encounter  Procedures  . CBC with Differential/Platelet  . Comprehensive metabolic panel  . Lipid panel   No orders of the defined types were placed in this encounter.    Lifestyle: Body mass  index is 22.32 kg/m. Wt Readings from Last 3 Encounters:  04/09/19 146 lb 12.8 oz (66.6 kg)  03/30/19 149 lb (67.6 kg)  02/05/19 146 lb (66.2 kg)     Patient Active Problem List   Diagnosis Date Noted  . Family history of osteoporosis in mother 04/09/2019  . Hip flexor tightness, left 08/25/2018  . Left hip pain 05/22/2018    L hip XR - 04/02/18    Health Maintenance  Topic Date Due  . COLONOSCOPY  02/12/2019  . MAMMOGRAM  05/07/2019  . PAP SMEAR-Modifier  04/03/2023  . TETANUS/TDAP  04/08/2028  . HIV Screening  Completed  . INFLUENZA VACCINE  Discontinued   Immunization History  Administered Date(s) Administered  . Tdap 04/08/2018   We updated and reviewed the patient's past history in detail and it is documented below. Allergies: Patient has No Known Allergies. Past Medical History Patient  has a past medical history of Family history of osteoporosis in mother (04/09/2019). Past Surgical History Patient  has no past surgical history on file. Family History: Patient family history includes Alcohol abuse in her brother and mother; COPD in her maternal grandmother; Cancer in her paternal grandmother; Healthy in her daughter and son; High blood pressure in her father; Osteoporosis in her mother; Stroke in her paternal grandfather. Social History:  Patient  reports that she has never smoked. She has never used smokeless tobacco. She reports current alcohol use. She reports that she does not use drugs.  Review of Systems: Constitutional: negative for fever or malaise Ophthalmic: negative for photophobia, double vision  or loss of vision Cardiovascular: negative for chest pain, dyspnea on exertion, or new LE swelling Respiratory: negative for SOB or persistent cough Gastrointestinal: negative for abdominal pain, change in bowel habits or melena Genitourinary: negative for dysuria or gross hematuria, no abnormal uterine bleeding or disharge Musculoskeletal: negative for new  gait disturbance or muscular weakness Integumentary: negative for new or persistent rashes, no breast lumps Neurological: negative for TIA or stroke symptoms Psychiatric: negative for SI or delusions Allergic/Immunologic: negative for hives  Patient Care Team    Relationship Specialty Notifications Start End  Leamon Arnt, MD PCP - General Family Medicine  04/02/18     Objective  Vitals: BP 116/68 (BP Location: Left Arm, Patient Position: Sitting, Cuff Size: Normal)   Pulse 99   Temp 97.7 F (36.5 C) (Temporal)   Ht 5\' 8"  (1.727 m)   Wt 146 lb 12.8 oz (66.6 kg)   SpO2 (!) 68%   BMI 22.32 kg/m  General:  Well developed, well nourished, no acute distress  Psych:  Alert and orientedx3,normal mood and affect HEENT:  Normocephalic, atraumatic, non-icteric sclera, PERRL, oropharynx is clear without mass or exudate, supple neck without adenopathy, mass or thyromegaly Cardiovascular:  Normal S1, S2, RRR without gallop, rub or murmur, nondisplaced PMI Respiratory:  Good breath sounds bilaterally, CTAB with normal respiratory effort Gastrointestinal: normal bowel sounds, soft, non-tender, no noted masses. No HSM MSK: no deformities, contusions. Joints are without erythema or swelling. Spine and CVA region are nontender Skin:  Warm, no rashes or suspicious lesions noted Neurologic:    Mental status is normal. CN 2-11 are normal. Gross motor and sensory exams are normal. Normal gait. No tremor Breast Exam: No mass, skin retraction or nipple discharge is appreciated in either breast. No axillary adenopathy. Fibrocystic changes are not noted   Commons side effects, risks, benefits, and alternatives for medications and treatment plan prescribed today were discussed, and the patient expressed understanding of the given instructions. Patient is instructed to call or message via MyChart if he/she has any questions or concerns regarding our treatment plan. No barriers to understanding were  identified. We discussed Red Flag symptoms and signs in detail. Patient expressed understanding regarding what to do in case of urgent or emergency type symptoms.   Medication list was reconciled, printed and provided to the patient in AVS. Patient instructions and summary information was reviewed with the patient as documented in the AVS. This note was prepared with assistance of Dragon voice recognition software. Occasional wrong-word or sound-a-like substitutions may have occurred due to the inherent limitations of voice recognition software  This visit occurred during the SARS-CoV-2 public health emergency.  Safety protocols were in place, including screening questions prior to the visit, additional usage of staff PPE, and extensive cleaning of exam room while observing appropriate contact time as indicated for disinfecting solutions.

## 2019-04-09 NOTE — Patient Instructions (Addendum)
Please return in 12 months for your annual complete physical; please come fasting.  I will release your lab results to you on your MyChart account with further instructions. Please reply with any questions.   You are due for your mammogram in February; Please call to schedule if you haven't already.   I recommend the Cologuard test for your colon cancer screening that is due. I have ordered this test for you. [facility] will soon contact you to verify your insurance, address etc. They will then send you the kit; follow the instructions in the kit and return the kit to Cologuard. They will run the test and send the results to me. I will then give you the results. If this test is negative, we recommend repeating a colon cancer screening test in 3 years. If it is positive, I will refer you to a Gastroenterologist so you can get set up for the recommended colonoscopy.  Thank you!  Today you were given your flu vaccination.   If you have any questions or concerns, please don't hesitate to send me a message via MyChart or call the office at [phone]. Thank you for visiting with [place] today! It's our pleasure caring for you.  Please do these things to maintain good health!   Exercise at least 30-45 minutes a day,  4-5 days a week.   Eat a low-fat diet with lots of fruits and vegetables, up to 7-9 servings per day.  Drink plenty of water daily. Try to drink 8 8oz glasses per day.  Seatbelts can save your life. Always wear your seatbelt.  Place Smoke Detectors on every level of your home and check batteries every year.  Schedule an appointment with an eye doctor for an eye exam every 1-2 years  Safe sex - use condoms to protect yourself from STDs if you could be exposed to these types of infections. Use birth control if you do not want to become pregnant and are sexually active.  Avoid heavy alcohol use. If you drink, keep it to less than 2 drinks/day and not every day.  [place].  Choose someone you trust that could speak for you if you became unable to speak for yourself.  Depression is common in our stressful world.If you're feeling down or losing interest in things you normally enjoy, please come in for a visit.  If anyone is threatening or hurting you, please get help. Physical or Emotional Violence is never OK.    Calcium Intake Recommendations You can take Caltrate Plus twice a day or get it through your diet or other OTC supplements (Viactiv, OsCal etc)  Calcium is a mineral that affects many functions in the body, including:  Blood clotting.  Blood vessel function.  Nerve impulse conduction.  Hormone secretion.  Muscle contraction.  Bone and teeth functions.  Most of your body's calcium supply is stored in your bones and teeth. When your calcium stores are low, you may be at risk for low bone mass, bone loss, and bone fractures. Consuming enough calcium helps to grow healthy bones and teeth and to prevent breakdown over time. It is very important that you get enough calcium if you are:  A child undergoing rapid growth.  An adolescent girl.  A pre- or post-menopausal woman.  A woman whose menstrual cycle has stopped due to anorexia nervosa or regular intense exercise.  An individual with lactose intolerance or a milk allergy.  A vegetarian.  What is  my plan? Try to consume the recommended amount of calcium daily based on your age. Depending on your overall health, your health care provider may recommend increased calcium intake.General daily calcium intake recommendations by age are:  Birth to 6 months: 200 mg.  Infants 7 to 12 months: 260 mg.  Children 1 to 3 years: 700 mg.  Children 4 to 8 years: 1,000 mg.  Children 9 to 13 years: 1,300 mg.  Teens 14 to 18 years: 1,300 mg.  Adults 19 to 50 years: 1,000 mg.  Adult women 51 to 70 years: 1,200 mg.  Adult men 51 to 70 years: 1,000 mg.  Adults 71 years  and older: 1,200 mg.  Pregnant and breastfeeding teens: 1,300 mg.  Pregnant and breastfeeding adults: 1,000 mg.  What do I need to know about calcium intake?  In order for the body to absorb calcium, it needs vitamin D. You can get vitamin D through (we recommend getting [phone] units of Vitamin D daily) ? Direct exposure of the skin to sunlight. ? Foods, such as egg yolks, liver, saltwater fish, and fortified milk. ? Supplements.  Consuming too much calcium may cause: ? Constipation. ? Decreased absorption of iron and zinc. ? Kidney stones.  Calcium supplements may interact with certain medicines. Check with your health care provider before starting any calcium supplements.  Try to get most of your calcium from food. What foods can I eat? Grains  Fortified oatmeal. Fortified ready-to-eat cereals. Fortified frozen waffles. Vegetables Turnip greens. Broccoli. Fruits Fortified orange juice. Meats and Other Protein Sources Canned sardines with bones. Canned salmon with bones. Soy beans. Tofu. Baked beans. Almonds. [place] nuts. Sunflower seeds. Dairy Milk. Yogurt. Cheese. Cottage cheese. Beverages Fortified soy milk. Fortified rice milk. Sweets/Desserts Pudding. Ice Cream. Milkshakes. Blackstrap molasses. The items listed above may not be a complete list of recommended foods or beverages. Contact your dietitian for more options. What foods can affect my calcium intake? It may be more difficult for your body to use calcium or calcium may leave your body more quickly if you consume large amounts of:  Sodium.  Protein.  Caffeine.  Alcohol.  This information is not intended to replace advice given to you by your health care provider. Make sure you discuss any questions you have with your health care provider. Document Released: 10/17/2003 Document Revised: 09/22/2015 Document Reviewed: 08/10/2013 Elsevier Interactive Patient Education  2018 [employer].

## 2019-04-27 ENCOUNTER — Ambulatory Visit: Payer: Commercial Managed Care - PPO | Admitting: Family Medicine

## 2019-05-01 ENCOUNTER — Encounter: Payer: Self-pay | Admitting: Family Medicine

## 2019-05-17 NOTE — Assessment & Plan Note (Signed)
Decision today to treat with OMT was based on Physical Exam  After verbal consent patient was treated with HVLA, ME, FPR techniques in  thoracic, lumbar and sacral areas  Patient tolerated the procedure well with improvement in symptoms  Patient given exercises, stretches and lifestyle modifications  See medications in patient instructions if given  Patient will follow up in 4-8 weeks 

## 2019-05-17 NOTE — Assessment & Plan Note (Signed)
Chronic problem with mild exacerbation.  Has responded somewhat to osteopathic manipulation.  Discussed posture and ergonomics.  Discussed medical management including the Celebrex and refilled.  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  Also patient is still recovering from Covid at the moment.  Follow-up again in 4 to 8 weeks

## 2019-05-17 NOTE — Progress Notes (Signed)
[place] Phone: [phone] Subjective:   [name], am serving as a scribe for Dr. Hulan Saas. This visit occurred during the SARS-CoV-2 public health emergency.  Safety protocols were in place, including screening questions prior to the visit, additional usage of staff PPE, and extensive cleaning of exam room while observing appropriate contact time as indicated for disinfecting solutions.   I'm seeing this patient by the request  of:  Leamon Arnt, MD  CC: Low back pain follow-up  [id number]  Kelly [name] is a 49 y.o. female coming in with complaint of back pain. Last seen for OMT on 03/19/2019. Patient states that her pain is worse when she is not as active. Has bad days where the left hip causes antalgic gait. Pain over hip flexor.  Feels it has been worsening at this moment.  Rates the severity of pain is 8 out of 10.  Patient states that it is starting to affect daily activities.  Still does a lot of moving of heavy stones for her gardening.     Past Medical History:  Diagnosis Date  . Family history of osteoporosis in mother 04/09/2019   No past surgical history on file. Social History   Socioeconomic History  . Marital status: Married    Spouse name: Not on file  . Number of children: Not on file  . Years of education: Not on file  . Highest education level: Not on file  Occupational History  . Not on file  Tobacco Use  . Smoking status: Never Smoker  . Smokeless tobacco: Never Used  Substance and Sexual Activity  . Alcohol use: Yes    Comment: beer or wine - seldom use  . Drug use: Never  . Sexual activity: Yes    Partners: Male    Birth control/protection: Post-menopausal  Other Topics Concern  . Not on file  Social History Narrative  . Not on file   Social Determinants of Health   Financial Resource Strain:   . Difficulty of Paying Living Expenses: Not on file  Food  Insecurity:   . Worried About Charity fundraiser in the Last Year: Not on file  . Ran Out of Food in the Last Year: Not on file  Transportation Needs:   . Lack of Transportation (Medical): Not on file  . Lack of Transportation (Non-Medical): Not on file  Physical Activity:   . Days of Exercise per Week: Not on file  . Minutes of Exercise per Session: Not on file  Stress:   . Feeling of Stress : Not on file  Social Connections:   . Frequency of Communication with Friends and Family: Not on file  . Frequency of Social Gatherings with Friends and Family: Not on file  . Attends Religious Services: Not on file  . Active Member of Clubs or Organizations: Not on file  . Attends Archivist Meetings: Not on file  . Marital Status: Not on file   No Known Allergies Family History  Problem Relation Age of Onset  . Alcohol abuse Mother   . Osteoporosis Mother   . High blood pressure Father   . Alcohol abuse Brother   . COPD Maternal Grandmother   . Cancer Paternal Grandmother   . Stroke Paternal Grandfather   . Healthy Daughter   . Healthy Son      Current Outpatient Medications (Cardiovascular):  .  nitroGLYCERIN (NITRO-DUR) 0.1 mg/hr patch,  1/4 patch daily   Current Outpatient Medications (Analgesics):  .  celecoxib (CELEBREX) 100 MG capsule, Take 1 capsule (100 mg total) by mouth 2 (two) times daily as needed.   Current Outpatient Medications (Other):  .  tiZANidine (ZANAFLEX) 4 MG capsule, 1 tablet at night   Reviewed prior external information including notes and imaging from  primary care provider As well as notes that were available from care everywhere and other healthcare systems.  Past medical history, social, surgical and family history all reviewed in electronic medical record.  No pertanent information unless stated regarding to the chief complaint.   Review of Systems:  No headache, visual changes, nausea, vomiting, diarrhea, constipation, dizziness,  abdominal pain, skin rash, fevers, chills, night sweats, weight loss, swollen lymph nodes, body aches, joint swelling, chest pain, shortness of breath, mood changes. POSITIVE muscle aches  Objective  Blood pressure 126/84, pulse 69, height 5\' 8"  (1.727 m), weight 148 lb (67.1 kg), SpO2 99 %.   General: No apparent distress alert and oriented x3 mood and affect normal, dressed appropriately.  HEENT: Pupils equal, extraocular movements intact  Respiratory: Patient's speak in full sentences and does not appear short of breath  Cardiovascular: No lower extremity edema, non tender, no erythema  Skin: Warm dry intact with no signs of infection or rash on extremities or on axial skeleton.  Abdomen: Soft nontender  Neuro: Cranial nerves II through XII are intact, neurovascularly intact in all extremities with 2+ DTRs and 2+ pulses.  Lymph: No lymphadenopathy of posterior or anterior cervical chain or axillae bilaterally.  Gait normal with good balance and coordination.  MSK:  Non tender with full range of motion and good stability and symmetric strength and tone of shoulders, elbows, wrist, hip, knee and ankles bilaterally.  Back Exam:  Inspection: Mild loss of lordosis Motion: Flexion 30 deg, Extension 25 deg, Side Bending to 35 deg bilaterally,  Rotation to 45 deg bilaterally  SLR laying: Negative  XSLR laying: Negative  Palpable tenderness: Tender to palpation in the paraspinal musculature lumbar spine right greater than left. FABER: Tightness right greater than left. Sensory change: Gross sensation intact to all lumbar and sacral dermatomes.  Reflexes: 2+ at both patellar tendons, 2+ at achilles tendons, Babinski's downgoing.  Strength at foot  Plantar-flexion: 5/5 Dorsi-flexion: 5/5 Eversion: 5/5 Inversion: 5/5  Leg strength  Quad: 5/5 Hamstring: 5/5 Hip flexor: 5/5 Hip abductors: 5/5  Gait unremarkable.  Osteopathic findings  T9 extended rotated and side bent left L2 flexed rotated  and side bent left Sacrum right on right    Impression and Recommendations:     This case required medical decision making of moderate complexity. The above documentation has been reviewed and is accurate and complete Lyndal Pulley, DO       Note: This dictation was prepared with Dragon dictation along with smaller phrase technology. Any transcriptional errors that result from this process are unintentional.

## 2019-05-18 ENCOUNTER — Ambulatory Visit: Payer: Commercial Managed Care - PPO | Admitting: Family Medicine

## 2019-05-18 ENCOUNTER — Encounter: Payer: Self-pay | Admitting: Family Medicine

## 2019-05-18 ENCOUNTER — Other Ambulatory Visit: Payer: Self-pay

## 2019-05-18 VITALS — BP 126/84 | HR 69 | Ht 68.0 in | Wt 148.0 lb

## 2019-05-18 DIAGNOSIS — M24552 Contracture, left hip: Secondary | ICD-10-CM

## 2019-05-18 DIAGNOSIS — M999 Biomechanical lesion, unspecified: Secondary | ICD-10-CM | POA: Diagnosis not present

## 2019-05-18 MED ORDER — TIZANIDINE HCL 4 MG PO CAPS: ORAL_CAPSULE | ORAL | 0 refills | Status: DC

## 2019-05-18 MED ORDER — NITROGLYCERIN 0.1 MG/HR TD PT24: MEDICATED_PATCH | TRANSDERMAL | 12 refills | Status: DC

## 2019-05-18 NOTE — Patient Instructions (Addendum)
See me again in 5 weeks  Nitroglycerin Protocol   Apply 1/4 nitroglycerin patch to affected area daily.  Change position of patch within the affected area every 24 hours.  You may experience a headache during the first 1-2 weeks of using the patch, these should subside.  If you experience headaches after beginning nitroglycerin patch treatment, you may take your preferred over the counter pain reliever.  Another side effect of the nitroglycerin patch is skin irritation or rash related to patch adhesive.  Please notify our office if you develop more severe headaches or rash, and stop the patch.  Tendon healing with nitroglycerin patch may require 12 to 24 weeks depending on the extent of injury.  Men should not use if taking Viagra, Cialis, or Levitra.   Do not use if you have migraines or rosacea.

## 2019-05-25 ENCOUNTER — Encounter: Payer: Self-pay | Admitting: Family Medicine

## 2019-06-09 ENCOUNTER — Other Ambulatory Visit: Payer: Self-pay | Admitting: Family Medicine

## 2019-06-22 ENCOUNTER — Ambulatory Visit (INDEPENDENT_AMBULATORY_CARE_PROVIDER_SITE_OTHER): Payer: Commercial Managed Care - PPO | Admitting: Family Medicine

## 2019-06-22 ENCOUNTER — Encounter: Payer: Self-pay | Admitting: Family Medicine

## 2019-06-22 ENCOUNTER — Other Ambulatory Visit: Payer: Self-pay

## 2019-06-22 VITALS — BP 114/72 | HR 59 | Ht 68.0 in | Wt 147.0 lb

## 2019-06-22 DIAGNOSIS — M999 Biomechanical lesion, unspecified: Secondary | ICD-10-CM

## 2019-06-22 DIAGNOSIS — M4300 Spondylolysis, site unspecified: Secondary | ICD-10-CM | POA: Diagnosis not present

## 2019-06-22 DIAGNOSIS — M4310 Spondylolisthesis, site unspecified: Secondary | ICD-10-CM | POA: Diagnosis not present

## 2019-06-22 MED ORDER — NAPROXEN 500 MG PO TABS: 500.0000 mg | ORAL_TABLET | Freq: Two times a day (BID) | ORAL | 2 refills | Status: DC | PRN

## 2019-06-22 NOTE — Patient Instructions (Addendum)
Naproxen BID  MRI [facility] [id number] See me again in 4-8 weeks

## 2019-06-22 NOTE — Assessment & Plan Note (Signed)

## 2019-06-22 NOTE — Assessment & Plan Note (Signed)
Known pars defect.  Patient having exacerbation.  MRI ordered today secondary to worsening pain that is affecting daily activities with mild radicular symptoms on the patient given new anti-inflammatory hopefully will be beneficial.  Attempted osteopathic manipulation but very difficult secondary to tightness.  Follow-up again in 4 to 8 weeks.

## 2019-06-22 NOTE — Progress Notes (Signed)
[name] [address] [name] Phone: [phone] Subjective:   I Kelly [name] am serving as a Education administrator for Dr. Hulan Saas.  This visit occurred during the SARS-CoV-2 public health emergency.  Safety protocols were in place, including screening questions prior to the visit, additional usage of staff PPE, and extensive cleaning of exam room while observing appropriate contact time as indicated for disinfecting solutions.   I'm seeing this patient by the request  of:  Leamon Arnt, MD  CC: Low back pain follow-up  [id number]  Kelly [name] is a 49 y.o. female coming in with complaint of back pain. Last seen for OMT 05/18/2019. Patient states she is still painful sitting and standing. ROM is still limited.  Continues to have significant tightness of the hip flexors.  Patient states though it seems to be limiting range of motion and is unable to workout as regularly secondary to the discomfort and pain.    Past Medical History:  Diagnosis Date  . Family history of osteoporosis in mother 04/09/2019   No past surgical history on file. Social History   Socioeconomic History  . Marital status: Married    Spouse name: Not on file  . Number of children: Not on file  . Years of education: Not on file  . Highest education level: Not on file  Occupational History  . Not on file  Tobacco Use  . Smoking status: Never Smoker  . Smokeless tobacco: Never Used  Substance and Sexual Activity  . Alcohol use: Yes    Comment: beer or wine - seldom use  . Drug use: Never  . Sexual activity: Yes    Partners: Male    Birth control/protection: Post-menopausal  Other Topics Concern  . Not on file  Social History Narrative  . Not on file   Social Determinants of Health   Financial Resource Strain:   . Difficulty of Paying Living Expenses:   Food Insecurity:   . Worried About Charity fundraiser in the Last Year:   . Arboriculturist  in the Last Year:   Transportation Needs:   . Film/video editor (Medical):   Marland Kitchen Lack of Transportation (Non-Medical):   Physical Activity:   . Days of Exercise per Week:   . Minutes of Exercise per Session:   Stress:   . Feeling of Stress :   Social Connections:   . Frequency of Communication with Friends and Family:   . Frequency of Social Gatherings with Friends and Family:   . Attends Religious Services:   . Active Member of Clubs or Organizations:   . Attends Archivist Meetings:   Marland Kitchen Marital Status:    No Known Allergies Family History  Problem Relation Age of Onset  . Alcohol abuse Mother   . Osteoporosis Mother   . High blood pressure Father   . Alcohol abuse Brother   . COPD Maternal Grandmother   . Cancer Paternal Grandmother   . Stroke Paternal Grandfather   . Healthy Daughter   . Healthy Son      Current Outpatient Medications (Cardiovascular):  .  nitroGLYCERIN (NITRO-DUR) 0.1 mg/hr patch, 1/4 patch daily   Current Outpatient Medications (Analgesics):  .  celecoxib (CELEBREX) 100 MG capsule, Take 1 capsule (100 mg total) by mouth 2 (two) times daily as needed. .  naproxen (NAPROSYN) 500 MG tablet, Take 1 tablet (500 mg total) by mouth 2 (two) times daily as needed.  Current Outpatient Medications (Other):  .  tiZANidine (ZANAFLEX) 4 MG capsule, TAKE 1 CAPSULE BY MOUTH EVERY DAY IN THE EVENING   Reviewed prior external information including notes and imaging from  primary care provider As well as notes that were available from care everywhere and other healthcare systems.  Past medical history, social, surgical and family history all reviewed in electronic medical record.  No pertanent information unless stated regarding to the chief complaint.   Review of Systems:  No headache, visual changes, nausea, vomiting, diarrhea, constipation, dizziness, abdominal pain, skin rash, fevers, chills, night sweats, weight loss, swollen lymph nodes, body  aches, joint swelling, chest pain, shortness of breath, mood changes. POSITIVE muscle aches  Objective  Blood pressure 114/72, pulse (!) 59, height 5\' 8"  (1.727 m), weight 147 lb (66.7 kg), SpO2 98 %.   General: No apparent distress alert and oriented x3 mood and affect normal, dressed appropriately.  HEENT: Pupils equal, extraocular movements intact  Respiratory: Patient's speak in full sentences and does not appear short of breath  Cardiovascular: No lower extremity edema, non tender, no erythema  Neuro: Cranial nerves II through XII are intact, neurovascularly intact in all extremities with 2+ DTRs and 2+ pulses.  Gait normal with good balance and coordination.  MSK: Significant decrease in range of motion of the left hip at the moment.  Positive straight leg test on the left side.  Patient even has decreased flexion of the hip secondary to tightness of the hip flexor.  Positive Corky Sox but worsening pain with the straight leg test.  Osteopathic findings T9 extended rotated and side bent left L2 flexed rotated and side bent right Sacrum r left on left      Impression and Recommendations:     This case required medical decision making of moderate complexity. The above documentation has been reviewed and is accurate and complete Lyndal Pulley, DO       Note: This dictation was prepared with Dragon dictation along with smaller phrase technology. Any transcriptional errors that result from this process are unintentional.

## 2019-06-23 ENCOUNTER — Other Ambulatory Visit: Payer: Self-pay | Admitting: Family Medicine

## 2019-06-23 DIAGNOSIS — Z1231 Encounter for screening mammogram for malignant neoplasm of breast: Secondary | ICD-10-CM

## 2019-07-05 ENCOUNTER — Ambulatory Visit
Admission: RE | Admit: 2019-07-05 | Discharge: 2019-07-05 | Disposition: A | Discharge status: Home / Self Care | Payer: Commercial Managed Care - PPO | Source: Ambulatory Visit | Attending: Family Medicine | Admitting: Family Medicine

## 2019-07-05 ENCOUNTER — Other Ambulatory Visit: Payer: Self-pay

## 2019-07-05 ENCOUNTER — Encounter: Payer: Self-pay | Admitting: Family Medicine

## 2019-07-05 DIAGNOSIS — Z1231 Encounter for screening mammogram for malignant neoplasm of breast: Secondary | ICD-10-CM

## 2019-07-19 ENCOUNTER — Other Ambulatory Visit: Payer: Self-pay

## 2019-07-19 ENCOUNTER — Ambulatory Visit
Admission: RE | Admit: 2019-07-19 | Discharge: 2019-07-19 | Disposition: A | Discharge status: Home / Self Care | Payer: Commercial Managed Care - PPO | Source: Ambulatory Visit | Attending: Family Medicine | Admitting: Family Medicine

## 2019-07-19 DIAGNOSIS — M4310 Spondylolisthesis, site unspecified: Secondary | ICD-10-CM

## 2019-07-19 DIAGNOSIS — M4300 Spondylolysis, site unspecified: Secondary | ICD-10-CM

## 2019-07-21 ENCOUNTER — Other Ambulatory Visit: Payer: Self-pay

## 2019-07-21 ENCOUNTER — Encounter: Payer: Self-pay | Admitting: Family Medicine

## 2019-07-21 DIAGNOSIS — M4300 Spondylolysis, site unspecified: Secondary | ICD-10-CM

## 2019-07-21 DIAGNOSIS — M4310 Spondylolisthesis, site unspecified: Secondary | ICD-10-CM

## 2019-08-02 ENCOUNTER — Other Ambulatory Visit: Payer: Self-pay

## 2019-08-02 ENCOUNTER — Ambulatory Visit
Admission: RE | Admit: 2019-08-02 | Discharge: 2019-08-02 | Disposition: A | Discharge status: Home / Self Care | Payer: Commercial Managed Care - PPO | Source: Ambulatory Visit | Attending: Family Medicine | Admitting: Family Medicine

## 2019-08-02 DIAGNOSIS — M4300 Spondylolysis, site unspecified: Secondary | ICD-10-CM

## 2019-08-02 MED ORDER — IOPAMIDOL (ISOVUE-M 200) INJECTION 41%
1.0000 mL | Freq: Once | INTRAMUSCULAR | Status: AC
  Administered 2019-08-02: 1 mL via EPIDURAL

## 2019-08-02 MED ORDER — METHYLPREDNISOLONE ACETATE 40 MG/ML INJ SUSP (RADIOLOG
120.0000 mg | Freq: Once | INTRAMUSCULAR | Status: AC
  Administered 2019-08-02: 120 mg via EPIDURAL

## 2019-08-02 NOTE — Discharge Instructions (Signed)

## 2019-08-03 ENCOUNTER — Ambulatory Visit: Payer: Commercial Managed Care - PPO | Admitting: Family Medicine

## 2019-08-27 ENCOUNTER — Encounter: Payer: Self-pay | Admitting: Family Medicine

## 2019-08-27 ENCOUNTER — Other Ambulatory Visit: Payer: Self-pay

## 2019-08-27 ENCOUNTER — Ambulatory Visit: Payer: Commercial Managed Care - PPO | Admitting: Family Medicine

## 2019-08-27 VITALS — BP 110/72 | HR 61 | Ht 68.0 in | Wt 150.0 lb

## 2019-08-27 DIAGNOSIS — M4310 Spondylolisthesis, site unspecified: Secondary | ICD-10-CM

## 2019-08-27 DIAGNOSIS — M999 Biomechanical lesion, unspecified: Secondary | ICD-10-CM

## 2019-08-27 DIAGNOSIS — M4300 Spondylolysis, site unspecified: Secondary | ICD-10-CM

## 2019-08-27 NOTE — Patient Instructions (Signed)
Give it 10 days then let me know if you need an epidural See me in 4 weeks for manipulation

## 2019-08-27 NOTE — Assessment & Plan Note (Signed)
Patient does have a pars defect noted and he did respond well to the epidural and has had about a 60 to 70% improvement.  Patient did respond well to manipulation.  Has muscle relaxers, Celebrex, we discussed the possibility of needing another epidural.  Patient will decide on this at a later date.  Discussed icing regimen and home exercises.  Follow-up again in 4 to 8 weeks.

## 2019-08-27 NOTE — Progress Notes (Signed)
[name] Phone: [phone] Subjective:   Kelly [name], am serving as a scribe for Dr. Hulan Saas. This visit occurred during the SARS-CoV-2 public health emergency.  Safety protocols were in place, including screening questions prior to the visit, additional usage of staff PPE, and extensive cleaning of exam room while observing appropriate contact time as indicated for disinfecting solutions.   I'm seeing this patient by the request  of:  Leamon Arnt, MD  CC: Low back pain follow-up  [id number]  Kelly [name] is a 49 y.o. female coming in with complaint of back and neck pain. OMT 06/22/2019. Patient states she had epidural on 08/02/2019 which provided a couple days of relief. One week later her pain intensified in a new location. Pain is now in lower left side of back going into lateral pelvis. Denies any pain radiating down the leg. Uses naproxen for pain.   Medications patient has been prescribed: Celebrex, Zanaflex as needed           Reviewed prior external information including notes and imaging from previsou exam, outside providers and external EMR if available.   As well as notes that were available from care everywhere and other healthcare systems.  Past medical history, social, surgical and family history all reviewed in electronic medical record.  [name] pertanent information unless stated regarding to the chief complaint.   Past Medical History:  Diagnosis Date  . Family history of osteoporosis in mother 04/09/2019    [name] Known Allergies [name] known drug allergies   Review of Systems:  [name] headache, visual changes, nausea, vomiting, diarrhea, constipation, dizziness, abdominal pain, skin rash, fevers, chills, night sweats, weight loss, swollen lymph nodes, body aches, joint swelling, chest pain, shortness of breath, mood changes. POSITIVE muscle aches  Objective  Blood pressure 110/72,  pulse 61, height 5\' 8"  (1.727 m), weight 150 lb (68 kg), SpO2 99 %.   General: [name] apparent distress alert and oriented x3 mood and affect normal, dressed appropriately.  HEENT: Pupils equal, extraocular movements intact  Respiratory: Patient's speak in full sentences and does not appear short of breath  Cardiovascular: [name] lower extremity edema, non tender, [name] erythema  Neuro: Cranial nerves II through XII are intact, neurovascularly intact in all extremities with 2+ DTRs and 2+ pulses.  Gait normal with good balance and coordination.  MSK:  Non tender with full range of motion and good stability and symmetric strength and tone of shoulders, elbows, wrist, hip, knee and ankles bilaterally.  Back -back exam still has significant decrease range of motion.  Mild increase in worsening pain with extension.  Patient has significant tightness of the left hip and the left sacroiliac joint.  This is in all planes.  [name] true radicular symptoms noted today which is a mild improvement from previous exam.  Osteopathic findings   T9 extended rotated and side bent left L2 flexed rotated and side bent right Sacrum right on right       Assessment and Plan:    Nonallopathic problems  Decision today to treat with OMT was based on Physical Exam  After verbal consent patient was treated with HVLA, ME, FPR techniques in cervical, rib, thoracic, lumbar, and sacral  areas  Patient tolerated the procedure well with improvement in symptoms  Patient given exercises, stretches and lifestyle modifications  See medications in patient instructions if given  Patient will follow up in 4-8 weeks  The above documentation has been reviewed and is accurate and complete Kelly Pulley, DO       Note: This dictation was prepared with Dragon dictation along with smaller phrase technology. Any transcriptional errors that result from this process are unintentional.

## 2019-08-27 NOTE — Assessment & Plan Note (Signed)

## 2019-09-12 ENCOUNTER — Other Ambulatory Visit: Payer: Self-pay | Admitting: Family Medicine

## 2019-09-16 ENCOUNTER — Other Ambulatory Visit: Payer: Self-pay

## 2019-09-16 ENCOUNTER — Encounter: Payer: Self-pay | Admitting: Family Medicine

## 2019-09-16 ENCOUNTER — Ambulatory Visit: Payer: Commercial Managed Care - PPO | Admitting: Family Medicine

## 2019-09-16 VITALS — BP 100/60 | HR 63 | Ht 68.0 in | Wt 151.0 lb

## 2019-09-16 DIAGNOSIS — M4300 Spondylolysis, site unspecified: Secondary | ICD-10-CM

## 2019-09-16 DIAGNOSIS — M999 Biomechanical lesion, unspecified: Secondary | ICD-10-CM

## 2019-09-16 DIAGNOSIS — M4310 Spondylolisthesis, site unspecified: Secondary | ICD-10-CM | POA: Diagnosis not present

## 2019-09-16 NOTE — Patient Instructions (Addendum)
Good to see you Manipulation today Keep doing exercises If not better injection next time See me again in 5-6

## 2019-09-16 NOTE — Progress Notes (Signed)
[place] [address] [place] Phone: [phone] Subjective:   I Kelly [name] am serving as a Education administrator for Dr. Hulan Saas.  This visit occurred during the SARS-CoV-2 public health emergency.  Safety protocols were in place, including screening questions prior to the visit, additional usage of staff PPE, and extensive cleaning of exam room while observing appropriate contact time as indicated for disinfecting solutions.   I'm seeing this patient by the request  of:  Leamon Arnt, MD  CC: Low back and hip pain follow-up  [id number]  Kelly [name] is a 50 y.o. female coming in with complaint of back and neck pain. Patient states she is still in pain. States she does not believe she is worse just about the same.   Medications patient has been prescribed: Celebrex Zanaflex          Reviewed prior external information including notes and imaging from previsou exam, outside providers and external EMR if available.   As well as notes that were available from care everywhere and other healthcare systems.  Past medical history, social, surgical and family history all reviewed in electronic medical record.  No pertanent information unless stated regarding to the chief complaint.   Past Medical History:  Diagnosis Date  . Family history of osteoporosis in mother 04/09/2019    No Known Allergies   Review of Systems:  No headache, visual changes, nausea, vomiting, diarrhea, constipation, dizziness, abdominal pain, skin rash, fevers, chills, night sweats, weight loss, swollen lymph nodes, body aches, joint swelling, chest pain, shortness of breath, mood changes. POSITIVE muscle aches  Objective  Blood pressure 100/60, pulse 63, height 5\' 8"  (1.727 m), weight 151 lb (68.5 kg), SpO2 97 %.   General: No apparent distress alert and oriented x3 mood and affect normal, dressed appropriately.  HEENT: Pupils equal, extraocular  movements intact  Respiratory: Patient's speak in full sentences and does not appear short of breath  Cardiovascular: No lower extremity edema, non tender, no erythema  Neuro: Cranial nerves II through XII are intact, neurovascularly intact in all extremities with 2+ DTRs and 2+ pulses.  Gait normal with good balance and coordination.  MSK:  Non tender with full range o ROM of all f motion and good stability and symmetric strength and tone of shoulders, elbows, wrist, hip, knee and ankles bilaterally.  Back -back exam does have some loss of lordosis.  Patient does have tenderness over the left sacroiliac joint that is fairly severe.  Positive Corky Sox.  Worsening pain with extension of the back.  Osteopathic findings   T9 extended rotated and side bent left L2 flexed rotated and side bent right Sacrum left on left       Assessment and Plan: Hip tightness  Pars defect-patient does have a pars defect and is likely more important contributing factor.  Left hip still continues to have the pain and I am do need to consider the possibility of a sacroiliac injection.  Patient will continue the medication.  Chronic problem with exacerbation.  Discussed continuing the Celebrex.  Follow-up again 4 to 8 weeks   Nonallopathic problems  Decision today to treat with OMT was based on Physical Exam  After verbal consent patient was treated with HVLA, ME, FPR techniques in thoracic, lumbar, and sacral  areas  Patient tolerated the procedure well with improvement in symptoms  Patient given exercises, stretches and lifestyle modifications  See medications in patient instructions if given  Patient will  follow up in 4-8 weeks      The above documentation has been reviewed and is accurate and complete Lyndal Pulley, DO       Note: This dictation was prepared with Dragon dictation along with smaller phrase technology. Any transcriptional errors that result from this process are unintentional.

## 2019-09-16 NOTE — Assessment & Plan Note (Signed)
If no improvement I would like to consider the possibility of sacroiliac injection for further diagnostic and potentially therapeutic

## 2019-10-06 ENCOUNTER — Encounter: Payer: Self-pay | Admitting: Family Medicine

## 2019-10-07 ENCOUNTER — Other Ambulatory Visit: Payer: Self-pay

## 2019-10-07 DIAGNOSIS — M545 Low back pain, unspecified: Secondary | ICD-10-CM

## 2019-10-12 ENCOUNTER — Ambulatory Visit
Admission: RE | Admit: 2019-10-12 | Discharge: 2019-10-12 | Disposition: A | Discharge status: Home / Self Care | Payer: Commercial Managed Care - PPO | Source: Ambulatory Visit | Attending: Family Medicine | Admitting: Family Medicine

## 2019-10-12 ENCOUNTER — Other Ambulatory Visit: Payer: Self-pay

## 2019-10-12 DIAGNOSIS — M545 Low back pain, unspecified: Secondary | ICD-10-CM

## 2019-10-12 MED ORDER — METHYLPREDNISOLONE ACETATE 40 MG/ML INJ SUSP (RADIOLOG
120.0000 mg | Freq: Once | INTRAMUSCULAR | Status: AC
  Administered 2019-10-12: 120 mg via EPIDURAL

## 2019-10-12 MED ORDER — IOPAMIDOL (ISOVUE-M 200) INJECTION 41%
1.0000 mL | Freq: Once | INTRAMUSCULAR | Status: AC
  Administered 2019-10-12: 1 mL via EPIDURAL

## 2019-10-12 NOTE — Discharge Instructions (Signed)

## 2019-10-13 ENCOUNTER — Other Ambulatory Visit: Payer: Self-pay | Admitting: Family Medicine

## 2019-10-26 ENCOUNTER — Ambulatory Visit: Payer: Commercial Managed Care - PPO | Admitting: Family Medicine

## 2019-10-26 ENCOUNTER — Encounter: Payer: Self-pay | Admitting: Family Medicine

## 2019-10-26 ENCOUNTER — Other Ambulatory Visit: Payer: Self-pay

## 2019-10-26 VITALS — BP 116/80 | HR 67 | Ht 68.0 in | Wt 141.0 lb

## 2019-10-26 DIAGNOSIS — M24552 Contracture, left hip: Secondary | ICD-10-CM

## 2019-10-26 DIAGNOSIS — M4310 Spondylolisthesis, site unspecified: Secondary | ICD-10-CM

## 2019-10-26 DIAGNOSIS — M4300 Spondylolysis, site unspecified: Secondary | ICD-10-CM | POA: Diagnosis not present

## 2019-10-26 DIAGNOSIS — M999 Biomechanical lesion, unspecified: Secondary | ICD-10-CM

## 2019-10-26 NOTE — Progress Notes (Signed)
[place] [address] [place] Phone: [phone] Subjective:   I Kelly [name] am serving as a Education administrator for Dr. Hulan Saas.  This visit occurred during the SARS-CoV-2 public health emergency.  Safety protocols were in place, including screening questions prior to the visit, additional usage of staff PPE, and extensive cleaning of exam room while observing appropriate contact time as indicated for disinfecting solutions.   I'm seeing this patient by the request  of:  Leamon Arnt, MD  CC: Neck and back pain follow-up  [id number]  Kelly [name] is a 49 y.o. female coming in with complaint of back and neck pain. OMT 09/16/2019. Patient states she is not feeling great today.  Has noticed more tightness.  Patient did have another epidural/nerve root on the L4-L5 area.  Patient feels that she had 100% improvement from 2 days and then the pain is slowly come back again.  Patient states that she has been trying a heel lift a little bit on the right foot with very minimal benefit at the moment.         Reviewed prior external information including notes and imaging from previsou exam, outside providers and external EMR if available.   As well as notes that were available from care everywhere and other healthcare systems.  Past medical history, social, surgical and family history all reviewed in electronic medical record.  No pertanent information unless stated regarding to the chief complaint.   Past Medical History:  Diagnosis Date  . Family history of osteoporosis in mother 04/09/2019    No Known Allergies   Review of Systems:  No headache, visual changes, nausea, vomiting, diarrhea, constipation, dizziness, abdominal pain, skin rash, fevers, chills, night sweats, weight loss, swollen lymph nodes, body aches, joint swelling, chest pain, shortness of breath, mood changes. POSITIVE muscle aches  Objective  Blood pressure  116/80, pulse 67, height 5\' 8"  (1.727 m), weight 141 lb (64 kg), SpO2 98 %.   General: No apparent distress alert and oriented x3 mood and affect normal, dressed appropriately.  HEENT: Pupils equal, extraocular movements intact  Respiratory: Patient's speak in full sentences and does not appear short of breath  Cardiovascular: No lower extremity edema, non tender, no erythema  Neuro: Cranial nerves II through XII are intact, neurovascularly intact in all extremities with 2+ DTRs and 2+ pulses.  Gait normal with good balance and coordination.  MSK:  Non tender with full range of motion and good stability and symmetric strength and tone of shoulders, elbows, wrist, hip, knee and ankles bilaterally.  Back -back exam does have some mild loss of lordosis.  Tightness in the paraspinal musculature on the left side of the spine and the left sacroiliac joint mild positive FABER test with some mild tightness in the left hip compared to the contralateral side.  Osteopathic findings  T7 extended rotated and side bent left L2 flexed rotated and side bent right Sacrum left on left       Assessment and Plan:    Nonallopathic problems  Decision today to treat with OMT was based on Physical Exam  After verbal consent patient was treated with HVLA, ME, FPR techniques in cervical, rib, thoracic, lumbar, and sacral  areas  Patient tolerated the procedure well with improvement in symptoms  Patient given exercises, stretches and lifestyle modifications  See medications in patient instructions if given  Patient will follow up in 4-8 weeks      The above  documentation has been reviewed and is accurate and complete Lyndal Pulley, DO       Note: This dictation was prepared with Dragon dictation along with smaller phrase technology. Any transcriptional errors that result from this process are unintentional.

## 2019-10-26 NOTE — Patient Instructions (Signed)
Making progress  See me again in 6-8 weeks

## 2019-10-26 NOTE — Assessment & Plan Note (Signed)
Known pars defect noted that patient has responded somewhat to the epidural and nerve root injections.  Continues to have significant tightness of the hip flexor as well.  Very difficult presentation.  Continues to respond somewhat to manipulation.  Patient wants to avoid any surgical intervention.  Has muscle relaxers and Celebrex.  Follow-up with me again 6 to 8 weeks

## 2019-11-26 ENCOUNTER — Other Ambulatory Visit: Payer: Self-pay | Admitting: Family Medicine

## 2019-12-13 NOTE — Progress Notes (Signed)
[place] Phone: [phone] Subjective:   [name], am serving as a scribe for Dr. Hulan Saas. This visit occurred during the SARS-CoV-2 public health emergency.  Safety protocols were in place, including screening questions prior to the visit, additional usage of staff PPE, and extensive cleaning of exam room while observing appropriate contact time as indicated for disinfecting solutions.   I'm seeing this patient by the request  of:  Leamon Arnt, MD  CC: Neck and back pain follow-up  [id number]   Brittany Osier is a 49 y.o. female coming in with complaint of back and neck pain. OMT 10/26/2019. Patient states that her left hip is tight and painful with sitting, walking, standing. Pain begins in groin but later in the day her pain is in the GT. Having a hard time sleeping.  Patient states that the left side seems to be worsening with range of motion.  Medications patient has been prescribed: Zanaflex, Celebrex, Naproxen  Taking: Yes         Reviewed prior external information including notes and imaging from previsou exam, outside providers and external EMR if available.   As well as notes that were available from care everywhere and other healthcare systems.  Past medical history, social, surgical and family history all reviewed in electronic medical record.  No pertanent information unless stated regarding to the chief complaint.   Past Medical History:  Diagnosis Date  . Family history of osteoporosis in mother 04/09/2019    No Known Allergies   Review of Systems:  No headache, visual changes, nausea, vomiting, diarrhea, constipation, dizziness, abdominal pain, skin rash, fevers, chills, night sweats, weight loss, swollen lymph nodes, body aches, joint swelling, chest pain, shortness of breath, mood changes. POSITIVE muscle aches  Objective  Blood pressure 112/72, pulse 88, height  5\' 8"  (1.727 m), weight 138 lb (62.6 kg), SpO2 98 %.   General: No apparent distress alert and oriented x3 mood and affect normal, dressed appropriately.  HEENT: Pupils equal, extraocular movements intact  Respiratory: Patient's speak in full sentences and does not appear short of breath  Cardiovascular: No lower extremity edema, non tender, no erythema  Neuro: Cranial nerves II through XII are intact, neurovascularly intact in all extremities with 2+ DTRs and 2+ pulses.  Gait antalgic Back -left hip now has decreased range of motion in internal and external range of motion fairly severe.  Internal of only 5 degrees, external of 10 degrees.  Patient even has decreasing forward flexion of the hip joint which is new.  Osteopathic findings   T9 extended rotated and side bent left L2 flexed rotated and side bent right Sacrum right on right       Assessment and Plan:  Left hip pain Patient is having worsening range of motion of the hip.  Patient does have elongation of the L5 transverse process noted but patient does have pain with the intra-articular internal range of motion noted of the hip.  At this point I do feel that repeating imaging including a MRI of the left hip which was greater than a year and a half ago as well as an MRI of the pelvis to further evaluate I think would be the most beneficial.  If this continues to give her trouble I do think we need to consider the possibility of referral to neurosurgery for further evaluation of the back if everything else is normal.  Pars defect with  spondylolisthesis Pars defect noted at L5.  The CD elongation of the transverse vertebrae on the left that seems to be causing may be some mild adhesions to the ileum on the left side.  This can decreased range of motion slightly but not the whole picture.  Advanced imaging warranted    Nonallopathic problems  Decision today to treat with OMT was based on Physical Exam  After verbal consent  patient was treated with HVLA, ME, FPR techniques in , thoracic, lumbar, and sacral  areas  Patient tolerated the procedure well with improvement in symptoms  Patient given exercises, stretches and lifestyle modifications  See medications in patient instructions if given  Patient will follow up in 4-8 weeks      The above documentation has been reviewed and is accurate and complete Lyndal Pulley, DO       Note: This dictation was prepared with Dragon dictation along with smaller phrase technology. Any transcriptional errors that result from this process are unintentional.

## 2019-12-14 ENCOUNTER — Ambulatory Visit (INDEPENDENT_AMBULATORY_CARE_PROVIDER_SITE_OTHER): Payer: Commercial Managed Care - PPO | Admitting: Family Medicine

## 2019-12-14 ENCOUNTER — Ambulatory Visit (INDEPENDENT_AMBULATORY_CARE_PROVIDER_SITE_OTHER): Payer: Commercial Managed Care - PPO

## 2019-12-14 ENCOUNTER — Other Ambulatory Visit: Payer: Self-pay

## 2019-12-14 ENCOUNTER — Ambulatory Visit: Payer: Commercial Managed Care - PPO

## 2019-12-14 ENCOUNTER — Encounter: Payer: Self-pay | Admitting: Family Medicine

## 2019-12-14 VITALS — BP 112/72 | HR 88 | Ht 68.0 in | Wt 138.0 lb

## 2019-12-14 DIAGNOSIS — M4310 Spondylolisthesis, site unspecified: Secondary | ICD-10-CM

## 2019-12-14 DIAGNOSIS — M25552 Pain in left hip: Secondary | ICD-10-CM | POA: Diagnosis not present

## 2019-12-14 DIAGNOSIS — M4300 Spondylolysis, site unspecified: Secondary | ICD-10-CM

## 2019-12-14 DIAGNOSIS — R1032 Left lower quadrant pain: Secondary | ICD-10-CM

## 2019-12-14 DIAGNOSIS — M999 Biomechanical lesion, unspecified: Secondary | ICD-10-CM | POA: Diagnosis not present

## 2019-12-14 NOTE — Patient Instructions (Addendum)
Xray today [facility] for MRI-Scheduling [phone] Will call you with results

## 2019-12-14 NOTE — Assessment & Plan Note (Signed)
Pars defect noted at L5.  The CD elongation of the transverse vertebrae on the left that seems to be causing may be some mild adhesions to the ileum on the left side.  This can decreased range of motion slightly but not the whole picture.  Advanced imaging warranted

## 2019-12-14 NOTE — Assessment & Plan Note (Signed)
Patient is having worsening range of motion of the hip.  Patient does have elongation of the L5 transverse process noted but patient does have pain with the intra-articular internal range of motion noted of the hip.  At this point I do feel that repeating imaging including a MRI of the left hip which was greater than a year and a half ago as well as an MRI of the pelvis to further evaluate I think would be the most beneficial.  If this continues to give her trouble I do think we need to consider the possibility of referral to neurosurgery for further evaluation of the back if everything else is normal.

## 2019-12-15 ENCOUNTER — Encounter: Payer: Self-pay | Admitting: Family Medicine

## 2019-12-15 ENCOUNTER — Other Ambulatory Visit: Payer: Self-pay

## 2019-12-15 DIAGNOSIS — M25552 Pain in left hip: Secondary | ICD-10-CM

## 2019-12-31 ENCOUNTER — Other Ambulatory Visit: Payer: Commercial Managed Care - PPO

## 2020-01-04 ENCOUNTER — Other Ambulatory Visit: Payer: Commercial Managed Care - PPO

## 2020-02-10 ENCOUNTER — Encounter: Payer: Self-pay | Admitting: Family Medicine

## 2020-02-14 ENCOUNTER — Encounter: Payer: Self-pay | Admitting: Family Medicine

## 2020-02-14 ENCOUNTER — Telehealth: Payer: Self-pay

## 2020-02-14 NOTE — Telephone Encounter (Signed)
Kelly [name] called in saying she is having surgery towards the end of December, and received a packet from the surgeon on Wednesday and is expected to have it filled out by 11/30. Kelly [name]'s last appointment with Dr.Andy is not until 12/6, could we work patient in on Wednesday?

## 2020-02-14 NOTE — Telephone Encounter (Signed)
Is she needing a preop appt? What kind of paperwork?

## 2020-02-14 NOTE — Telephone Encounter (Signed)
Patient was needing a pre op workup along with needing a packet filled out, and Kelly [name] is now scheduled for an appointment on Monday.

## 2020-02-15 NOTE — Telephone Encounter (Signed)
Please schedule lab visit and she likely has a preop sheet from ortho that needs to be completed.  Had pe in jan 2021 so can just fill it out

## 2020-02-16 ENCOUNTER — Other Ambulatory Visit: Payer: Self-pay

## 2020-02-16 ENCOUNTER — Encounter: Payer: Self-pay | Admitting: Family Medicine

## 2020-02-16 ENCOUNTER — Ambulatory Visit: Payer: Commercial Managed Care - PPO | Admitting: Family Medicine

## 2020-02-16 VITALS — BP 112/82 | HR 70 | Ht 68.0 in | Wt 142.0 lb

## 2020-02-16 DIAGNOSIS — M2550 Pain in unspecified joint: Secondary | ICD-10-CM | POA: Diagnosis not present

## 2020-02-16 DIAGNOSIS — M25552 Pain in left hip: Secondary | ICD-10-CM | POA: Diagnosis not present

## 2020-02-16 DIAGNOSIS — Z01812 Encounter for preprocedural laboratory examination: Secondary | ICD-10-CM

## 2020-02-16 LAB — CBC WITH DIFFERENTIAL/PLATELET
Basophils Absolute: 0 10*3/uL (ref 0.0–0.1)
Basophils Relative: 0.6 % (ref 0.0–3.0)
Eosinophils Absolute: 0.1 10*3/uL (ref 0.0–0.7)
Eosinophils Relative: 1 % (ref 0.0–5.0)
HCT: 40.8 % (ref 36.0–46.0)
Hemoglobin: 13.7 g/dL (ref 12.0–15.0)
Lymphocytes Relative: 24.7 % (ref 12.0–46.0)
Lymphs Abs: 1.8 10*3/uL (ref 0.7–4.0)
MCHC: 33.6 g/dL (ref 30.0–36.0)
MCV: 90.8 fl (ref 78.0–100.0)
Monocytes Absolute: 0.5 10*3/uL (ref 0.1–1.0)
Monocytes Relative: 6.8 % (ref 3.0–12.0)
Neutro Abs: 4.8 10*3/uL (ref 1.4–7.7)
Neutrophils Relative %: 66.9 % (ref 43.0–77.0)
Platelets: 297 10*3/uL (ref 150.0–400.0)
RBC: 4.49 Mil/uL (ref 3.87–5.11)
RDW: 12.4 % (ref 11.5–15.5)
WBC: 7.1 10*3/uL (ref 4.0–10.5)

## 2020-02-16 LAB — BASIC METABOLIC PANEL
BUN: 17 mg/dL (ref 6–23)
CO2: 31 mEq/L (ref 19–32)
Calcium: 9.7 mg/dL (ref 8.4–10.5)
Chloride: 100 mEq/L (ref 96–112)
Creatinine, Ser: 0.97 mg/dL (ref 0.40–1.20)
GFR: 67.9 mL/min (ref >60.00)
Glucose, Bld: 95 mg/dL (ref 70–99)
Potassium: 4 mEq/L (ref 3.5–5.1)
Sodium: 140 mEq/L (ref 135–145)

## 2020-02-16 LAB — PROTIME-INR
INR: 0.9 ratio (ref 0.8–1.0)
Prothrombin Time: 10.5 s (ref 9.6–13.1)

## 2020-02-16 NOTE — Patient Instructions (Signed)
Good to see you  Happy Holliday's You are in good hand with Dr. Rhona Raider We will send over all the information.

## 2020-02-16 NOTE — Progress Notes (Signed)
  [place] Phone: [phone] Subjective:   Kelly [name], am serving as a scribe for Dr. Hulan Saas. This visit occurred during the SARS-CoV-2 public health emergency.  Safety protocols were in place, including screening questions prior to the visit, additional usage of staff PPE, and extensive cleaning of exam room while observing appropriate contact time as indicated for disinfecting solutions.   I'm seeing this patient by the request  of:  Kelly Arnt, MD  CC: Left hip pain  [id number]  Kelly [name] is a 49 y.o. female coming in with complaint of hip pain.  Patient is here for preop clearance.  Medications patient has been prescribed: Zanaflex, Celebrex, Naproxen           Reviewed prior external information including notes and imaging from previsou exam, outside providers and external EMR if available.   As well as notes that were available from care everywhere and other healthcare systems.  Past medical history, social, surgical and family history all reviewed in electronic medical record.  [name] pertanent information unless stated regarding to the chief complaint.   Past Medical History:  Diagnosis Date  . Family history of osteoporosis in mother 04/09/2019    [name] Known Allergies   Review of Systems:  [name] headache, visual changes, nausea, vomiting, diarrhea, constipation, dizziness, abdominal pain, skin rash, fevers, chills, night sweats, weight loss, swollen lymph nodes, body aches, joint swelling, chest pain, shortness of breath, mood changes. POSITIVE muscle aches  Objective  Blood pressure 112/82, pulse 70, height 5\' 8"  (1.727 m), weight 142 lb (64.4 kg), SpO2 98 %.   General: [name] apparent distress alert and oriented x3 mood and affect normal, dressed appropriately.  HEENT: Pupils equal, extraocular movements intact  Respiratory: Patient's speak in full sentences and does not  appear short of breath  Cardiovascular: [name] lower extremity edema, non tender, [name] erythema regular rate and rhythm [name] murmur heard Neuro: Cranial nerves II through XII are intact, neurovascularly intact in all extremities with 2+ DTRs and 2+ pulses.  Gait antalgic       Assessment and Plan:       The above documentation has been reviewed and is accurate and complete Lyndal Pulley, DO       Note: This dictation was prepared with Dragon dictation along with smaller phrase technology. Any transcriptional errors that result from this process are unintentional.

## 2020-02-16 NOTE — Assessment & Plan Note (Signed)
Severe arthritic changes.  Will be undergoing surgical intervention.  EKG is normal.  Will forward this as well as labs only get them to the orthopedic surgeon.  Patient seems to be cleared from a cardiac and medical standpoint.

## 2020-02-21 ENCOUNTER — Ambulatory Visit: Payer: Commercial Managed Care - PPO | Admitting: Family Medicine

## 2020-03-02 DIAGNOSIS — Z96642 Presence of left artificial hip joint: Secondary | ICD-10-CM

## 2020-03-02 HISTORY — DX: Presence of left artificial hip joint: Z96.642

## 2020-03-02 HISTORY — PX: TOTAL HIP ARTHROPLASTY: SHX124

## 2020-04-10 ENCOUNTER — Ambulatory Visit (INDEPENDENT_AMBULATORY_CARE_PROVIDER_SITE_OTHER): Payer: Commercial Managed Care - PPO | Admitting: Family Medicine

## 2020-04-10 ENCOUNTER — Encounter: Payer: Self-pay | Admitting: Family Medicine

## 2020-04-10 ENCOUNTER — Other Ambulatory Visit: Payer: Self-pay

## 2020-04-10 VITALS — BP 120/74 | HR 81 | Temp 97.5°F | Wt 139.0 lb

## 2020-04-10 DIAGNOSIS — Z1212 Encounter for screening for malignant neoplasm of rectum: Secondary | ICD-10-CM

## 2020-04-10 DIAGNOSIS — Z1211 Encounter for screening for malignant neoplasm of colon: Secondary | ICD-10-CM

## 2020-04-10 DIAGNOSIS — Z96642 Presence of left artificial hip joint: Secondary | ICD-10-CM | POA: Diagnosis not present

## 2020-04-10 DIAGNOSIS — Z1159 Encounter for screening for other viral diseases: Secondary | ICD-10-CM

## 2020-04-10 DIAGNOSIS — Z0000 Encounter for general adult medical examination without abnormal findings: Secondary | ICD-10-CM | POA: Diagnosis not present

## 2020-04-10 LAB — LIPID PANEL
Cholesterol: 192 mg/dL (ref 0–200)
HDL: 72.8 mg/dL (ref >39.00)
LDL Cholesterol: 83 mg/dL (ref 0–99)
NonHDL: 119.13
Total CHOL/HDL Ratio: 3
Triglycerides: 179 mg/dL — ABNORMAL HIGH (ref 0.0–149.0)
VLDL: 35.8 mg/dL (ref 0.0–40.0)

## 2020-04-10 LAB — COMPREHENSIVE METABOLIC PANEL
ALT: 13 U/L (ref 0–35)
AST: 15 U/L (ref 0–37)
Albumin: 4.7 g/dL (ref 3.5–5.2)
Alkaline Phosphatase: 62 U/L (ref 39–117)
BUN: 13 mg/dL (ref 6–23)
CO2: 32 mEq/L (ref 19–32)
Calcium: 10.1 mg/dL (ref 8.4–10.5)
Chloride: 102 mEq/L (ref 96–112)
Creatinine, Ser: 0.91 mg/dL (ref 0.40–1.20)
GFR: 73.23 mL/min (ref >60.00)
Glucose, Bld: 85 mg/dL (ref 70–99)
Potassium: 4.1 mEq/L (ref 3.5–5.1)
Sodium: 141 mEq/L (ref 135–145)
Total Bilirubin: 0.7 mg/dL (ref 0.2–1.2)
Total Protein: 7.2 g/dL (ref 6.0–8.3)

## 2020-04-10 LAB — CBC WITH DIFFERENTIAL/PLATELET
Basophils Absolute: 0 10*3/uL (ref 0.0–0.1)
Basophils Relative: 0.8 % (ref 0.0–3.0)
Eosinophils Absolute: 0.1 10*3/uL (ref 0.0–0.7)
Eosinophils Relative: 1.2 % (ref 0.0–5.0)
HCT: 39.4 % (ref 36.0–46.0)
Hemoglobin: 13.2 g/dL (ref 12.0–15.0)
Lymphocytes Relative: 28.9 % (ref 12.0–46.0)
Lymphs Abs: 1.5 10*3/uL (ref 0.7–4.0)
MCHC: 33.4 g/dL (ref 30.0–36.0)
MCV: 89.9 fl (ref 78.0–100.0)
Monocytes Absolute: 0.4 10*3/uL (ref 0.1–1.0)
Monocytes Relative: 7.9 % (ref 3.0–12.0)
Neutro Abs: 3.1 10*3/uL (ref 1.4–7.7)
Neutrophils Relative %: 61.2 % (ref 43.0–77.0)
Platelets: 266 10*3/uL (ref 150.0–400.0)
RBC: 4.38 Mil/uL (ref 3.87–5.11)
RDW: 13.7 % (ref 11.5–15.5)
WBC: 5.1 10*3/uL (ref 4.0–10.5)

## 2020-04-10 NOTE — Patient Instructions (Addendum)
Please return in 12 months for your annual complete physical; please come fasting.  I will release your lab results to you on your MyChart account with further instructions. Please reply with any questions.   I recommend the Cologuard test for your colon cancer screening that is due. I have ordered this test for you. [facility] will soon contact you to verify your insurance, address etc. They will then send you the kit; follow the instructions in the kit and return the kit to [facility]. They will run the test and send the results to me. I will then give you the results. If this test is negative, we recommend repeating a colon cancer screening test in 3 years. If it is positive, I will refer you to a Gastroenterologist so you can get set up for the recommended colonoscopy.  Thank you!  If you have any questions or concerns, please don't hesitate to send me a message via MyChart or call the office at [phone]. Thank you for visiting with [place] today! It's our pleasure caring for you.  So glad you are doing great! Let me know if you need anything.

## 2020-04-10 NOTE — Progress Notes (Signed)
Subjective  Chief Complaint  Patient presents with  . Annual Exam    Non-fasting  . Health Maintenance    Was never contacted from Cologuard ordered at last CPE    HPI: Kelly [name] is a 50 y.o. female who presents to [facility] today for a Female Wellness Visit.   Wellness Visit: annual visit with health maintenance review and exam without Pap   HM: due CRC screen: prefers cologuard. Average risk patient. mammo and pap up to date. imms up to date. Very healthy lifestyle.  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   Assessment    1. Annual physical exam   2. Need for hepatitis C screening test   3. Status post total hip replacement, left   4. Screening for colorectal cancer      Plan  Female Wellness Visit:  Age appropriate Health Maintenance and Prevention measures were discussed with patient. Included topics are cancer screening recommendations, ways to keep healthy (see AVS) including dietary and exercise recommendations, regular eye and dental care, use of seat belts, and avoidance of moderate alcohol use and tobacco use. Reordered cologuard.   BMI: discussed patient's BMI and encouraged positive lifestyle modifications to help get to or maintain a target BMI.  HM needs and immunizations were addressed and ordered. See below for orders. See HM and immunization section for updates.  Routine labs and screening tests ordered including cmp, cbc and lipids where appropriate.  Discussed recommendations regarding Vit D and calcium supplementation (see AVS)  Follow up: 12 mo for cpe   Orders Placed This Encounter  Procedures  . Hepatitis C antibody  . CBC with Differential/Platelet  . Comprehensive metabolic panel  . Lipid panel  . Cologuard   No orders of the defined types were placed in this encounter.    Lifestyle: Body mass index is 21.13 kg/m. Wt Readings from Last 3 Encounters:  04/10/20  139 lb (63 kg)  02/16/20 142 lb (64.4 kg)  12/14/19 138 lb (62.6 kg)    Patient Active Problem List   Diagnosis Date Noted  . Status post total hip replacement, left 04/10/2020  . Pars defect with spondylolisthesis 06/22/2019  . Family history of osteoporosis in mother 04/09/2019   Health Maintenance  Topic Date Due  . Hepatitis C Screening  Never done  . Fecal DNA (Cologuard)  Never done  . MAMMOGRAM  07/04/2020  . PAP SMEAR-Modifier  04/03/2023  . TETANUS/TDAP  04/08/2028  . COVID-19 Vaccine  Completed  . HIV Screening  Completed  . INFLUENZA VACCINE  Discontinued   Immunization History  Administered Date(s) Administered  . Influenza,inj,Quad PF,6+ Mos 04/09/2019  . PFIZER(Purple Top)SARS-COV-2 Vaccination 05/13/2019, 06/04/2019, 12/24/2019  . Tdap 04/08/2018   We updated and reviewed the patient's past history in detail and it is documented below. Allergies: Patient has No Known Allergies. Past Medical History Patient  has a past medical history of Family history of osteoporosis in mother (04/09/2019) and History of left hip replacement (03/02/2020). Past Surgical History Patient  has a past surgical history that includes Total hip arthroplasty (Left, 03/02/2020). Family History: Patient family history includes Alcohol abuse in her brother and mother; COPD in her maternal grandmother; Cancer in her paternal grandmother; Healthy in her daughter and son; High blood pressure in her father; Osteoporosis in her mother; Stroke in her paternal grandfather. Social History:  Patient  reports that she has never smoked. She has never used  smokeless tobacco. She reports current alcohol use. She reports that she does not use drugs.  Review of Systems: Constitutional: negative for fever or malaise Ophthalmic: negative for photophobia, double vision or loss of vision Cardiovascular: negative for chest pain, dyspnea on exertion, or new LE swelling Respiratory: negative for SOB or  persistent cough Gastrointestinal: negative for abdominal pain, change in bowel habits or melena Genitourinary: negative for dysuria or gross hematuria, no abnormal uterine bleeding or disharge Musculoskeletal: negative for new gait disturbance or muscular weakness Integumentary: negative for new or persistent rashes, no breast lumps Neurological: negative for TIA or stroke symptoms Psychiatric: negative for SI or delusions Allergic/Immunologic: negative for hives  Patient Care Team    Relationship Specialty Notifications Start End  Leamon Arnt, MD PCP - General Family Medicine  04/02/18   Melrose Nakayama, MD Consulting Physician Orthopedic Surgery  04/10/20     Objective  Vitals: BP 120/74   Pulse 81   Temp (!) 97.5 F (36.4 C) (Temporal)   Wt 139 lb (63 kg)   SpO2 98%   BMI 21.13 kg/m  General:  Well developed, well nourished, no acute distress  Psych:  Alert and orientedx3,normal mood and affect HEENT:  Normocephalic, atraumatic, non-icteric sclera, supple neck without adenopathy, mass or thyromegaly Cardiovascular:  Normal S1, S2, RRR without gallop, rub or murmur Respiratory:  Good breath sounds bilaterally, CTAB with normal respiratory effort Gastrointestinal: normal bowel sounds, soft, non-tender, no noted masses. No HSM MSK: no deformities, contusions. Joints are without erythema or swelling. Well healing scar left hip. Skin:  Warm, no rashes or suspicious lesions noted Neurologic:    Mental status is normal. CN 2-11 are normal. Gross motor and sensory exams are normal. Normal gait. No tremor Breast Exam: No mass, skin retraction or nipple discharge is appreciated in either breast. No axillary adenopathy. Fibrocystic changes are not noted   Commons side effects, risks, benefits, and alternatives for medications and treatment plan prescribed today were discussed, and the patient expressed understanding of the given instructions. Patient is instructed to call or message  via MyChart if he/she has any questions or concerns regarding our treatment plan. No barriers to understanding were identified. We discussed Red Flag symptoms and signs in detail. Patient expressed understanding regarding what to do in case of urgent or emergency type symptoms.   Medication list was reconciled, printed and provided to the patient in AVS. Patient instructions and summary information was reviewed with the patient as documented in the AVS. This note was prepared with assistance of Dragon voice recognition software. Occasional wrong-word or sound-a-like substitutions may have occurred due to the inherent limitations of voice recognition software  This visit occurred during the SARS-CoV-2 public health emergency.  Safety protocols were in place, including screening questions prior to the visit, additional usage of staff PPE, and extensive cleaning of exam room while observing appropriate contact time as indicated for disinfecting solutions.

## 2020-04-11 LAB — HEPATITIS C ANTIBODY
Hepatitis C Ab: NONREACTIVE
SIGNAL TO CUT-OFF: 0.01 (ref <1.00)

## 2020-05-11 ENCOUNTER — Encounter: Payer: Self-pay | Admitting: Family Medicine

## 2020-05-31 LAB — COLOGUARD
Cologuard: NEGATIVE
Cologuard: NEGATIVE

## 2020-06-02 ENCOUNTER — Encounter: Payer: Self-pay | Admitting: Family Medicine

## 2020-06-14 ENCOUNTER — Encounter: Payer: Self-pay | Admitting: Family Medicine

## 2020-07-07 NOTE — Progress Notes (Signed)
[place] Phone: [phone] Subjective:   [name], am serving as a scribe for Dr. Hulan Saas. This visit occurred during the SARS-CoV-2 public health emergency.  Safety protocols were in place, including screening questions prior to the visit, additional usage of staff PPE, and extensive cleaning of exam room while observing appropriate contact time as indicated for disinfecting solutions.   I'm seeing this patient by the request  of:  Leamon Arnt, MD  CC: Hip pain pain bump  [id number]  Kelly [name] is a 50 y.o. female coming in with complaint of L hip pain. Patient states she is 3 month SP hip replacement. Pain in groin is gone post replacement. Patient is wearing lift in R shoe. For first week her back, L hip and L knee hurt.  Pain in R glute and lateral hip. Notices that lateral hip is more prominent on L side. Patient is able to be active.      Past Medical History:  Diagnosis Date  . Family history of osteoporosis in mother 04/09/2019  . 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 TOTAL HIP ARTHROPLASTY Left 03/02/2020   Dr. Latanya Maudlin   Social History   Socioeconomic History  . Marital status: Married    Spouse name: Not on file  . Number of children: Not on file  . Years of education: Not on file  . Highest education level: Not on file  Occupational History  . Occupation: Pharmacist, hospital, [employer]  Tobacco Use  . Smoking status: Never Smoker  . Smokeless tobacco: Never Used  Vaping Use  . Vaping Use: Never used  Substance and Sexual Activity  . Alcohol use: Yes    Comment: beer or wine - seldom use  . Drug use: Never  . Sexual activity: Yes    Partners: Male    Birth control/protection: Post-menopausal  Other Topics Concern  . Not on file  Social History Narrative  . Not on file   Social Determinants of Health    Financial Resource Strain: Not on file  Food Insecurity: Not on file  Transportation Needs: Not on file  Physical Activity: Not on file  Stress: Not on file  Social Connections: Not on file   No Known Allergies Family History  Problem Relation Age of Onset  . Alcohol abuse Mother   . Osteoporosis Mother   . High blood pressure Father   . Alcohol abuse Brother   . COPD Maternal Grandmother   . Cancer Paternal Grandmother   . Stroke Paternal Grandfather   . Healthy Daughter   . Healthy Son          Current Outpatient Medications (Other):  .  tiZANidine (ZANAFLEX) 4 MG capsule, TAKE 1 CAPSULE BY MOUTH EVERY DAY IN THE EVENING   Reviewed prior external information including notes and imaging from  primary care provider As well as notes that were available from care everywhere and other healthcare systems.  Past medical history, social, surgical and family history all reviewed in electronic medical record.  No pertanent information unless stated regarding to the chief complaint.   Review of Systems:  No headache, visual changes, nausea, vomiting, diarrhea, constipation, dizziness, abdominal pain, skin rash, fevers, chills, night sweats, weight loss, swollen lymph nodes, joint swelling, chest pain, shortness of breath, mood changes. POSITIVE muscle aches, body aches  Objective  Blood pressure 122/86, pulse 70,  height 5\' 8"  (1.727 m), weight 148 lb (67.1 kg), SpO2 99 %.   General: No apparent distress alert and oriented x3 mood and affect normal, dressed appropriately.  HEENT: Pupils equal, extraocular movements intact  Respiratory: Patient's speak in full sentences and does not appear short of breath  Cardiovascular: No lower extremity edema, non tender, no erythema  Gait normal with good balance and coordination.  MSK: With standing now patient's left hip does seem to go more lateral.  Patient has difficulty with left translation noted.  Seems to have hard stop.  Right  translation of the hip and pelvis is unremarkable.  Patient's low back very minimal tightness noted in the paraspinal musculature.  Mild tightness with Corky Sox on the left compared to the right.  Patient does have a positive Trendelenburg fairly severe on the left side.  Mild atrophy of the left-sided cath noted.  Limited musculoskeletal ultrasound was performed and interpreted by Lyndal Pulley  Limited ultrasound of patient's left hip does not show any type of hematoma.  Very small amount of a bursitis noted in the greater trochanteric area.  Some very mild chronic possible small intrasubstance tearing noted in the gluteal tendon laterally.  No significant increase though in Doppler flow or neovascularization in the area.  Medial hip near the scar may have some mild hypoechoic changes at the prosthesis but no true signs of any type of infectious etiology noted.  Soft tissues appear to be unremarkable Impression: Fairly unremarkable lateral hip ultrasound    Impression and Recommendations:     The above documentation has been reviewed and is accurate and complete Lyndal Pulley, DO

## 2020-07-10 ENCOUNTER — Ambulatory Visit: Payer: Self-pay

## 2020-07-10 ENCOUNTER — Encounter: Payer: Self-pay | Admitting: Family Medicine

## 2020-07-10 ENCOUNTER — Ambulatory Visit: Payer: Commercial Managed Care - PPO | Admitting: Family Medicine

## 2020-07-10 ENCOUNTER — Ambulatory Visit (INDEPENDENT_AMBULATORY_CARE_PROVIDER_SITE_OTHER): Payer: Commercial Managed Care - PPO

## 2020-07-10 ENCOUNTER — Other Ambulatory Visit: Payer: Self-pay

## 2020-07-10 VITALS — BP 122/86 | HR 70 | Ht 68.0 in | Wt 148.0 lb

## 2020-07-10 DIAGNOSIS — M2550 Pain in unspecified joint: Secondary | ICD-10-CM | POA: Diagnosis not present

## 2020-07-10 DIAGNOSIS — M25552 Pain in left hip: Secondary | ICD-10-CM | POA: Diagnosis not present

## 2020-07-10 DIAGNOSIS — Z96642 Presence of left artificial hip joint: Secondary | ICD-10-CM

## 2020-07-10 LAB — COMPREHENSIVE METABOLIC PANEL
ALT: 13 U/L (ref 0–35)
AST: 16 U/L (ref 0–37)
Albumin: 4.4 g/dL (ref 3.5–5.2)
Alkaline Phosphatase: 68 U/L (ref 39–117)
BUN: 13 mg/dL (ref 6–23)
CO2: 30 mEq/L (ref 19–32)
Calcium: 10.1 mg/dL (ref 8.4–10.5)
Chloride: 104 mEq/L (ref 96–112)
Creatinine, Ser: 0.93 mg/dL (ref 0.40–1.20)
GFR: 71.22 mL/min (ref >60.00)
Glucose, Bld: 96 mg/dL (ref 70–99)
Potassium: 4 mEq/L (ref 3.5–5.1)
Sodium: 141 mEq/L (ref 135–145)
Total Bilirubin: 1 mg/dL (ref 0.2–1.2)
Total Protein: 7.3 g/dL (ref 6.0–8.3)

## 2020-07-10 LAB — CBC WITH DIFFERENTIAL/PLATELET
Basophils Absolute: 0 10*3/uL (ref 0.0–0.1)
Basophils Relative: 0.5 % (ref 0.0–3.0)
Eosinophils Absolute: 0 10*3/uL (ref 0.0–0.7)
Eosinophils Relative: 0.8 % (ref 0.0–5.0)
HCT: 40.5 % (ref 36.0–46.0)
Hemoglobin: 13.8 g/dL (ref 12.0–15.0)
Lymphocytes Relative: 24.2 % (ref 12.0–46.0)
Lymphs Abs: 1.5 10*3/uL (ref 0.7–4.0)
MCHC: 34 g/dL (ref 30.0–36.0)
MCV: 89.5 fl (ref 78.0–100.0)
Monocytes Absolute: 0.4 10*3/uL (ref 0.1–1.0)
Monocytes Relative: 6.2 % (ref 3.0–12.0)
Neutro Abs: 4.3 10*3/uL (ref 1.4–7.7)
Neutrophils Relative %: 68.3 % (ref 43.0–77.0)
Platelets: 249 10*3/uL (ref 150.0–400.0)
RBC: 4.53 Mil/uL (ref 3.87–5.11)
RDW: 13.5 % (ref 11.5–15.5)
WBC: 6.3 10*3/uL (ref 4.0–10.5)

## 2020-07-10 LAB — SEDIMENTATION RATE: Sed Rate: 5 mm/hr (ref 0–30)

## 2020-07-10 LAB — C-REACTIVE PROTEIN: CRP: <1 mg/dL (ref 0.5–20.0)

## 2020-07-10 NOTE — Assessment & Plan Note (Signed)
Patient continues to have obvious left hip discomfort.  We will get x-rays to further evaluate.  We will see what type of angled with the prosthesis exam.  Patient does not have any instability.  This may be a case more of unfortunately vanity with patient having fairly good range of motion.  Patient does though unfortunately does have some weakness noted of the left hip.  Because of this I would like to consider nerve conduction test to further evaluate secondary to the weakness.  Noting to see if there is any nerve injury that could be contributing.  Patient will be following up with a another provider in a different state to further evaluate if something else is wrong at this time.  Follow-up with me again 6 weeks

## 2020-07-10 NOTE — Patient Instructions (Signed)
Good to see you  Lets get labs today- will try ESR, CRP CBC, CMET, cobalt and chromium  xrays today of the left hip today  Nerve conduction test noted,  You do have weakness of the left hip noted and need to try to find cause  Send me a message Monday and tell me what [facility] has to offer as well

## 2020-07-16 ENCOUNTER — Encounter: Payer: Self-pay | Admitting: Family Medicine

## 2020-07-16 LAB — CHROMIUM AND COBALT, WB (MOM)
Chromium: <1 ng/mL (ref <3.0)
Cobalt: <1 ng/mL (ref <3.0)

## 2020-07-19 ENCOUNTER — Encounter: Payer: Self-pay | Admitting: Family Medicine

## 2020-09-20 ENCOUNTER — Encounter: Payer: Self-pay | Admitting: Family Medicine

## 2020-09-21 ENCOUNTER — Other Ambulatory Visit: Payer: Self-pay

## 2020-09-21 ENCOUNTER — Encounter: Payer: Self-pay | Admitting: Family

## 2020-09-21 ENCOUNTER — Ambulatory Visit: Payer: Commercial Managed Care - PPO | Admitting: Family

## 2020-09-21 VITALS — BP 120/76 | HR 61 | Temp 98.1°F | Ht 68.0 in | Wt 143.2 lb

## 2020-09-21 DIAGNOSIS — K130 Diseases of lips: Secondary | ICD-10-CM

## 2020-09-21 DIAGNOSIS — D489 Neoplasm of uncertain behavior, unspecified: Secondary | ICD-10-CM

## 2020-09-21 NOTE — Progress Notes (Signed)
Acute Office Visit  Subjective:    Patient ID: Kelly [name], female    DOB: [birth year], 51 y.o.   MRN: [id number]  Chief Complaint  Patient presents with  . lip lesion    Pt c/o lesion on left bottom lip x 1 year. She said it gets a hard covering and scab like and when scab comes off it bleed. Pt would like a dermatology referral.    HPI Patient is in today with concerns of a lesion has been on her left bottom lip for about a year.  She reports that he will often scab up, she peels it off, it bleeds and then returns.  Denies any changes in the size, shape, or color.  No known family history of any skin cancers.  Would like a referral to dermatology.  Has not tried any therapy.  Past Medical History:  Diagnosis Date  . Family history of osteoporosis in mother 04/09/2019  . 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 TOTAL HIP ARTHROPLASTY Left 03/02/2020   Dr. Latanya Maudlin    Family History  Problem Relation Age of Onset  . Alcohol abuse Mother   . Osteoporosis Mother   . High blood pressure Father   . Alcohol abuse Brother   . COPD Maternal Grandmother   . Cancer Paternal Grandmother   . Stroke Paternal Grandfather   . Healthy Daughter   . Healthy Son     Social History   Socioeconomic History  . Marital status: Married    Spouse name: Not on file  . Number of children: Not on file  . Years of education: Not on file  . Highest education level: Not on file  Occupational History  . Occupation: Pharmacist, hospital, [employer]  Tobacco Use  . Smoking status: Never  . Smokeless tobacco: Never  Vaping Use  . Vaping Use: Never used  Substance and Sexual Activity  . Alcohol use: Yes    Comment: beer or wine - seldom use  . Drug use: Never  . Sexual activity: Yes    Partners: Male    Birth control/protection: Post-menopausal  Other Topics Concern  . Not on file  Social History Narrative  . Not on file   Social  Determinants of Health   Financial Resource Strain: Not on file  Food Insecurity: Not on file  Transportation Needs: Not on file  Physical Activity: Not on file  Stress: Not on file  Social Connections: Not on file  Intimate Partner Violence: Not on file    Outpatient Medications Prior to Visit  Medication Sig Dispense Refill  . meloxicam (MOBIC) 7.5 MG tablet Take 7.5 mg by mouth daily.    Marland Kitchen tiZANidine (ZANAFLEX) 4 MG capsule TAKE 1 CAPSULE BY MOUTH EVERY DAY IN THE EVENING 30 capsule 0  . naproxen (NAPROSYN) 500 MG tablet Take by mouth. (Patient not taking: Reported on 09/21/2020)     No facility-administered medications prior to visit.    No Known Allergies  Review of Systems  Constitutional: Negative.   Respiratory: Negative.    Cardiovascular: Negative.   Skin:        Skin lesion to the left lower lip x1 year  Allergic/Immunologic: Negative.   Neurological: Negative.   Psychiatric/Behavioral: Negative.    All other systems reviewed and are negative.     Objective:    Physical Exam Vitals and nursing note reviewed.  Constitutional:      Appearance:  Normal appearance.  Cardiovascular:     Rate and Rhythm: Normal rate and regular rhythm.  Pulmonary:     Effort: Pulmonary effort is normal.     Breath sounds: Normal breath sounds.  Musculoskeletal:        General: Normal range of motion.     Cervical back: Normal range of motion and neck supple.  Skin:    General: Skin is warm and dry.     Findings: Lesion present.     Comments: 0.5 cm, round lesion to the left lower lip.  Flesh-colored.  Mildly irritated.  No active bleeding  Neurological:     General: No focal deficit present.     Mental Status: She is alert and oriented to person, place, and time.  Psychiatric:        Mood and Affect: Mood normal.        Behavior: Behavior normal.   BP 120/76 (BP Location: Left Arm, Patient Position: Sitting, Cuff Size: Normal)   Pulse 61   Temp 98.1 F (36.7 C)  (Temporal)   Ht 5\' 8"  (1.727 m)   Wt 143 lb 4 oz (65 kg)   SpO2 97%   BMI 21.78 kg/m  Wt Readings from Last 3 Encounters:  09/21/20 143 lb 4 oz (65 kg)  07/10/20 148 lb (67.1 kg)  04/10/20 139 lb (63 kg)    Health Maintenance Due  Topic Date Due  . Zoster Vaccines- Shingrix (1 of 2) Never done  . COVID-19 Vaccine (4 - Booster for Pfizer series) 04/25/2020  . MAMMOGRAM  07/04/2020    There are no preventive care reminders to display for this patient.   Lab Results  Component Value Date   TSH 1.65 09/22/2018   Lab Results  Component Value Date   WBC 6.3 07/10/2020   HGB 13.8 07/10/2020   HCT 40.5 07/10/2020   MCV 89.5 07/10/2020   PLT 249.0 07/10/2020   Lab Results  Component Value Date   NA 141 07/10/2020   K 4.0 07/10/2020   CO2 30 07/10/2020   GLUCOSE 96 07/10/2020   BUN 13 07/10/2020   CREATININE 0.93 07/10/2020   BILITOT 1.0 07/10/2020   ALKPHOS 68 07/10/2020   AST 16 07/10/2020   ALT 13 07/10/2020   PROT 7.3 07/10/2020   ALBUMIN 4.4 07/10/2020   CALCIUM 10.1 07/10/2020   GFR 71.22 07/10/2020   Lab Results  Component Value Date   CHOL 192 04/10/2020   Lab Results  Component Value Date   HDL 72.80 04/10/2020   Lab Results  Component Value Date   LDLCALC 83 04/10/2020   Lab Results  Component Value Date   TRIG 179.0 (H) 04/10/2020   Lab Results  Component Value Date   CHOLHDL 3 04/10/2020   No results found for: HGBA1C     Assessment & Plan:   Problem List Items Addressed This Visit   None Visit Diagnoses     Neoplasm of uncertain behavior    -  Primary   Relevant Orders   Ambulatory referral to Dermatology   Sore of lower lip       Relevant Orders   Ambulatory referral to Dermatology      Plan: Referral to dermatology placed likely to have the lesion biopsied.  Call the office with any questions or concerns.  Recheck as scheduled and sooner as needed.  No orders of the defined types were placed in this  encounter.    Kennyth Arnold, FNP

## 2021-03-03 IMAGING — MR MRI OF THE LEFT HIP WITH CONTRAST
4 of 5 series · 18 of 40 positions shown · IV contrast (agent unspecified)
Comparison: Plain films left hip 04/02/2018.

CLINICAL DATA: Chronic left hip pain. No known injury. Pain began
after giving birth 17 years ago.

EXAM:
MRI OF THE LEFT HIP WITH CONTRAST (MR Arthrogram)
TECHNIQUE: Multiplanar, multisequence MR imaging of the hip was performed
immediately following contrast injection into the hip joint under
fluoroscopic guidance. No intravenous contrast was administered.

[Series 3: T1 · coronal · 4.0mm · 0.53mm/px · 3 of 24 slices shown]
[im 3/24]
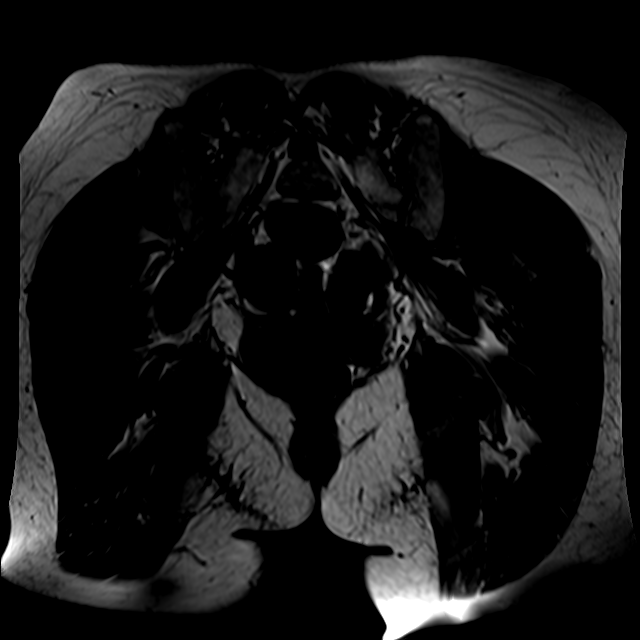
[im 12/24]
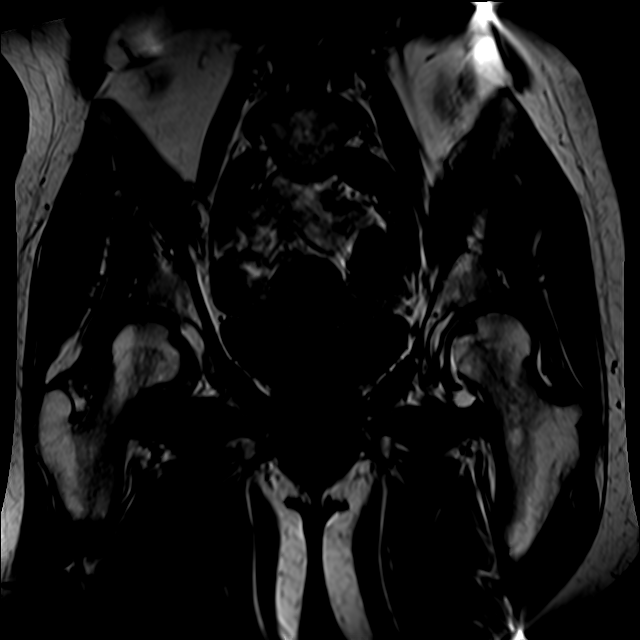
[im 21/24]
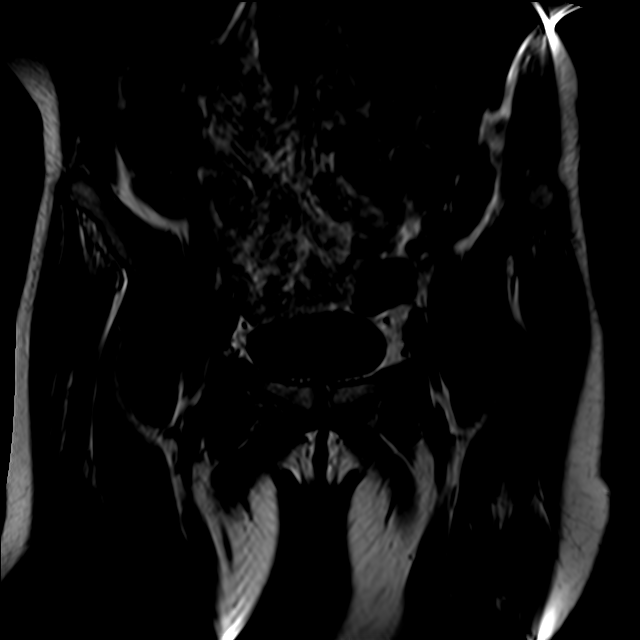

[Series 4: T2 fat-sat · coronal · 4.0mm · 0.53mm/px · 9 of 24 slices shown]
[im 1/24]
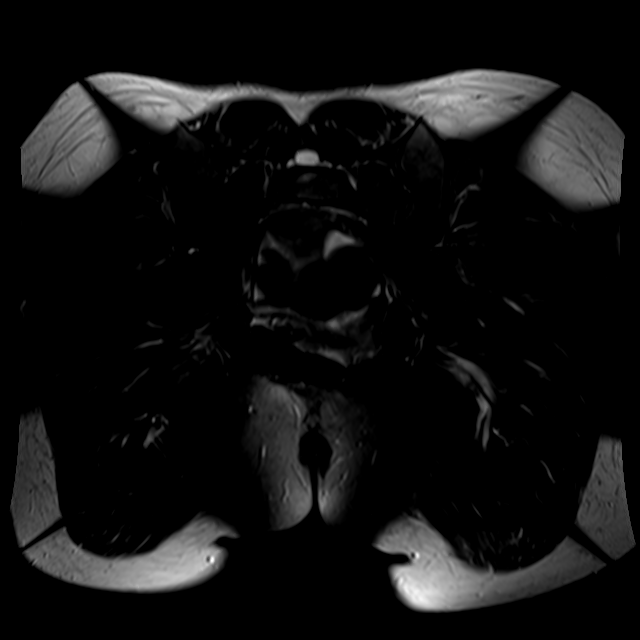
[im 3/24]
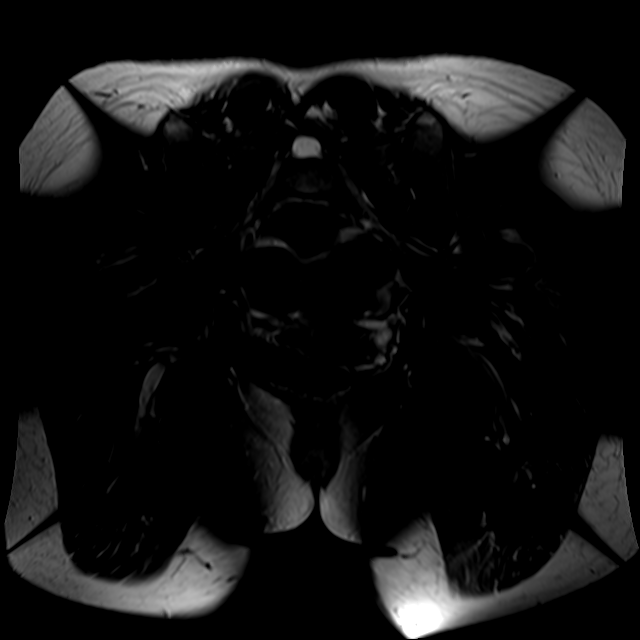
[im 6/24]
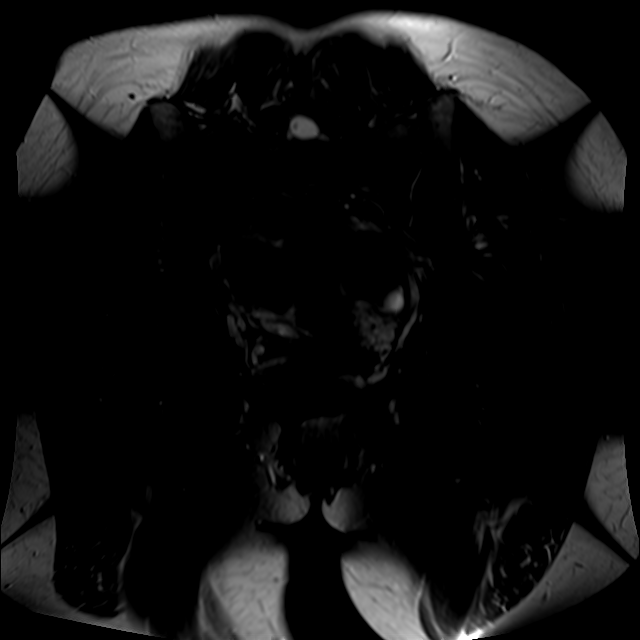
[im 9/24]
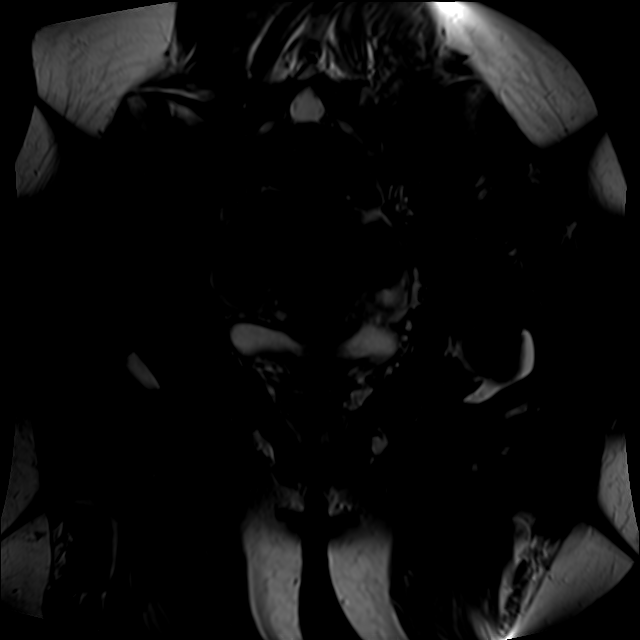
[im 12/24]
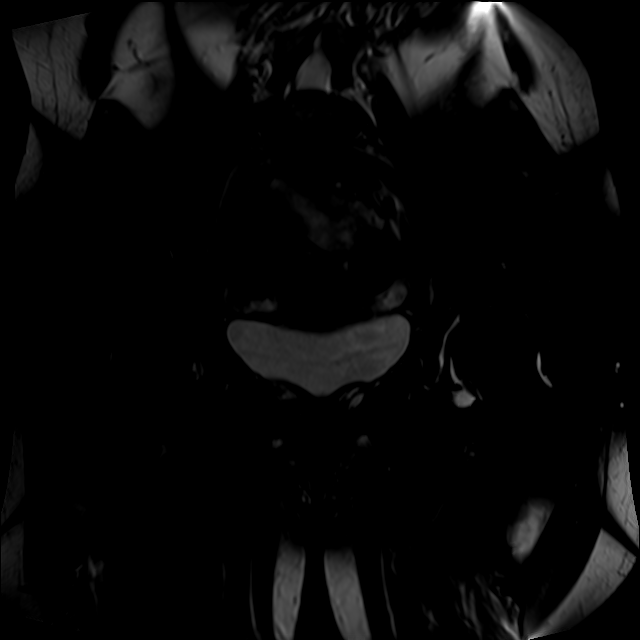
[im 15/24]
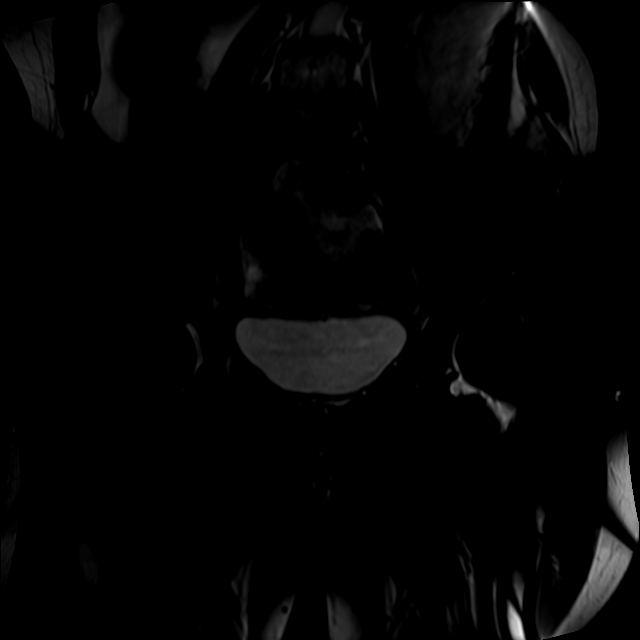
[im 18/24]
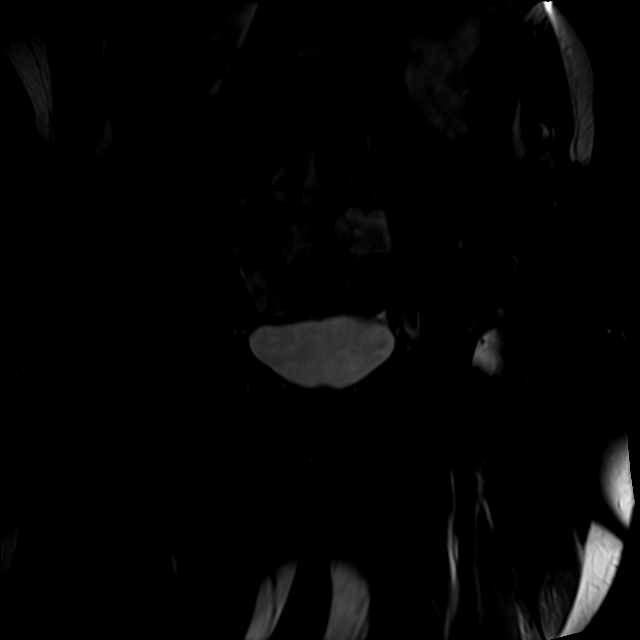
[im 21/24]
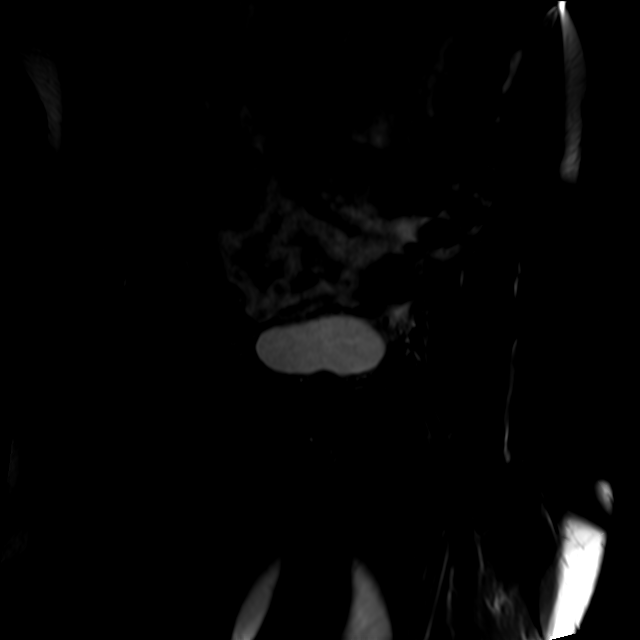
[im 24/24]
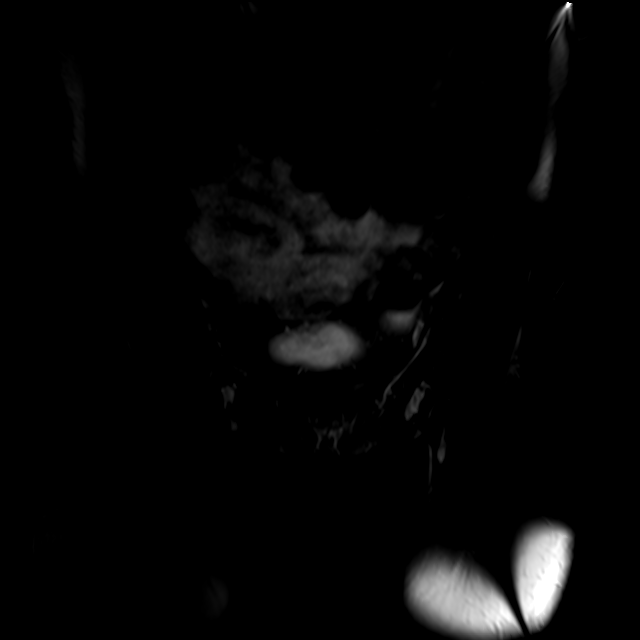

[Series 5: T1 fat-sat · oblique · 4.0mm · 0.70mm/px · 3 of 20 slices shown (1 of 2)]
[im 4/20]
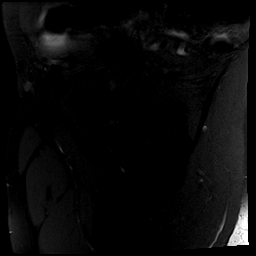
[im 10/20]
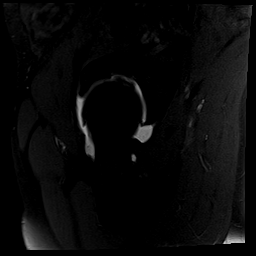
[im 16/20]
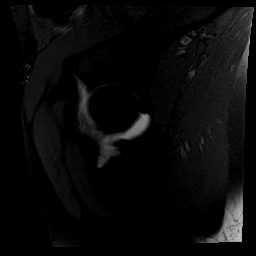

[Series 6: T1 fat-sat · sagittal · 4.0mm · 0.70mm/px · 3 of 24 slices shown (2 of 2)]
[im 4/24]
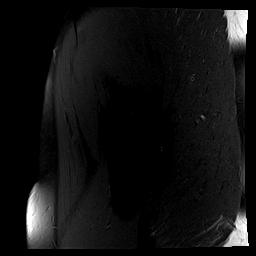
[im 14/24]
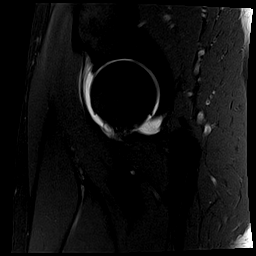
[im 20/24]
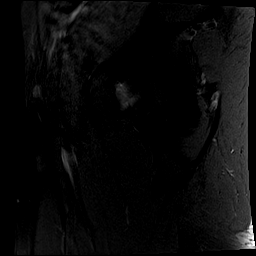

[18 of 40 positions shown; findings below may reference images not displayed]

FINDINGS: Bones: Small focus of subchondral edema is seen in the left
acetabular roof. Marrow signal is otherwise normal without fracture,
stress change or worrisome lesion. No avascular necrosis of the
femoral heads.

Articular cartilage and labrum

Articular cartilage: Mildly thinned along the superior aspect of the
acetabulum.

Labrum: There is some fraying of the anterior, superior labrum but
no tear is identified.

Joint or bursal effusion

Joint effusion: The left hip is distended with contrast. No right
hip joint effusion.

Bursae: Negative.

Muscles and tendons

Muscles and tendons:  Intact and normal appearance.

Other findings

Miscellaneous:   Imaged intrapelvic contents appear normal.
IMPRESSION: Negative for labral tear. The patient has mild right hip
degenerative disease with cartilage thinning, fraying of the
anterior, superior labrum and a small focus of mild subchondral
edema in the acetabulum. The study is otherwise negative.

## 2021-04-11 ENCOUNTER — Encounter: Payer: Commercial Managed Care - PPO | Admitting: Family Medicine

## 2021-07-11 ENCOUNTER — Ambulatory Visit (HOSPITAL_COMMUNITY)
Admission: EM | Admit: 2021-07-11 | Discharge: 2021-07-12 | Disposition: A | Discharge status: Home / Self Care | Payer: No Payment, Other | Attending: Family | Admitting: Family

## 2021-07-11 DIAGNOSIS — F419 Anxiety disorder, unspecified: Secondary | ICD-10-CM | POA: Diagnosis not present

## 2021-07-11 DIAGNOSIS — Z20822 Contact with and (suspected) exposure to covid-19: Secondary | ICD-10-CM | POA: Diagnosis not present

## 2021-07-11 DIAGNOSIS — R45851 Suicidal ideations: Secondary | ICD-10-CM | POA: Diagnosis not present

## 2021-07-11 DIAGNOSIS — F332 Major depressive disorder, recurrent severe without psychotic features: Secondary | ICD-10-CM | POA: Insufficient documentation

## 2021-07-11 LAB — POCT URINE DRUG SCREEN - MANUAL ENTRY (I-SCREEN)
POC Amphetamine UR: NOT DETECTED
POC Buprenorphine (BUP): NOT DETECTED
POC Cocaine UR: NOT DETECTED
POC Marijuana UR: NOT DETECTED
POC Methadone UR: NOT DETECTED
POC Methamphetamine UR: NOT DETECTED
POC Morphine: NOT DETECTED
POC Oxazepam (BZO): NOT DETECTED
POC Oxycodone UR: NOT DETECTED
POC Secobarbital (BAR): NOT DETECTED

## 2021-07-11 LAB — CBC WITH DIFFERENTIAL/PLATELET
Abs Immature Granulocytes: 0.02 10*3/uL (ref 0.00–0.07)
Basophils Absolute: 0 10*3/uL (ref 0.0–0.1)
Basophils Relative: 1 %
Eosinophils Absolute: 0.1 10*3/uL (ref 0.0–0.5)
Eosinophils Relative: 1 %
HCT: 42.4 % (ref 36.0–46.0)
Hemoglobin: 14 g/dL (ref 12.0–15.0)
Immature Granulocytes: 0 %
Lymphocytes Relative: 17 %
Lymphs Abs: 1.5 10*3/uL (ref 0.7–4.0)
MCH: 30.9 pg (ref 26.0–34.0)
MCHC: 33 g/dL (ref 30.0–36.0)
MCV: 93.6 fL (ref 80.0–100.0)
Monocytes Absolute: 0.4 10*3/uL (ref 0.1–1.0)
Monocytes Relative: 5 %
Neutro Abs: 6.8 10*3/uL (ref 1.7–7.7)
Neutrophils Relative %: 76 %
Platelets: 294 10*3/uL (ref 150–400)
RBC: 4.53 MIL/uL (ref 3.87–5.11)
RDW: 11.9 % (ref 11.5–15.5)
WBC: 8.8 10*3/uL (ref 4.0–10.5)
nRBC: 0 % (ref 0.0–0.2)

## 2021-07-11 LAB — COMPREHENSIVE METABOLIC PANEL
ALT: 16 U/L (ref 0–44)
AST: 19 U/L (ref 15–41)
Albumin: 4.4 g/dL (ref 3.5–5.0)
Alkaline Phosphatase: 57 U/L (ref 38–126)
Anion gap: 9 (ref 5–15)
BUN: 9 mg/dL (ref 6–20)
CO2: 30 mmol/L (ref 22–32)
Calcium: 9.8 mg/dL (ref 8.9–10.3)
Chloride: 103 mmol/L (ref 98–111)
Creatinine, Ser: 0.95 mg/dL (ref 0.44–1.00)
GFR, Estimated: >60 mL/min (ref >60)
Glucose, Bld: 103 mg/dL — ABNORMAL HIGH (ref 70–99)
Potassium: 4 mmol/L (ref 3.5–5.1)
Sodium: 142 mmol/L (ref 135–145)
Total Bilirubin: 1 mg/dL (ref 0.3–1.2)
Total Protein: 7 g/dL (ref 6.5–8.1)

## 2021-07-11 LAB — RESP PANEL BY RT-PCR (FLU A&B, COVID) ARPGX2
Influenza A by PCR: NEGATIVE
Influenza B by PCR: NEGATIVE
SARS Coronavirus 2 by RT PCR: NEGATIVE

## 2021-07-11 LAB — TSH: TSH: 0.794 u[IU]/mL (ref 0.350–4.500)

## 2021-07-11 LAB — POCT PREGNANCY, URINE: Preg Test, Ur: NEGATIVE

## 2021-07-11 LAB — POC SARS CORONAVIRUS 2 AG: SARSCOV2ONAVIRUS 2 AG: NEGATIVE

## 2021-07-11 MED ORDER — IBUPROFEN 600 MG PO TABS
600.0000 mg | ORAL_TABLET | Freq: Three times a day (TID) | ORAL | Status: DC | PRN
  Administered 2021-07-11: 600 mg via ORAL
  Filled 2021-07-11: qty 1

## 2021-07-11 MED ORDER — TRAZODONE HCL 50 MG PO TABS: 50.0000 mg | ORAL_TABLET | Freq: Every evening | ORAL | Status: DC | PRN

## 2021-07-11 MED ORDER — ALUM & MAG HYDROXIDE-SIMETH 200-200-20 MG/5ML PO SUSP: 30.0000 mL | ORAL | Status: DC | PRN

## 2021-07-11 MED ORDER — ACETAMINOPHEN 325 MG PO TABS: 650.0000 mg | ORAL_TABLET | Freq: Four times a day (QID) | ORAL | Status: DC | PRN

## 2021-07-11 MED ORDER — HYDROXYZINE HCL 25 MG PO TABS: 25.0000 mg | ORAL_TABLET | Freq: Three times a day (TID) | ORAL | Status: DC | PRN

## 2021-07-11 MED ORDER — MAGNESIUM HYDROXIDE 400 MG/5ML PO SUSP: 30.0000 mL | Freq: Every day | ORAL | Status: DC | PRN

## 2021-07-11 NOTE — ED Notes (Signed)
Pt arrived to the unit.

## 2021-07-11 NOTE — Progress Notes (Signed)
Patient asking for Ibuprofen, RN informed provider of request via secure chat. ?

## 2021-07-11 NOTE — ED Notes (Signed)
Patient resting with no sxs of distress - will continue to monitor for safety 

## 2021-07-11 NOTE — ED Provider Notes (Signed)
[facility] Continuous Assessment Admission H&P ? ?Date: 07/11/21 ?Patient Name: Kelly [name] ?MRN: [id number] ?Chief Complaint:  ?Chief Complaint  ?Patient presents with  ? Suicidal  ?   ? ?Diagnoses:  ?Final diagnoses:  ?Severe episode of recurrent major depressive disorder, without psychotic features ([place])  ? ? ?HPI: [name] is a 51 year old Caucasian female that presents with worsening depression and anxiety.  She reports making suicidal statements earlier to her divorce attorney after discovering that her her husband is considering withdrawing child support and alimony support.  She reported " I said that I should just ended."  However,  now is recanting her story. She is denying suicidal or homicidal ideations. Denied previous suicide attempts. ? ? 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  Patient was escorted off school grounds due to current mental state.  ? ?Denim reports offered outpatient partial hospitalization programming for medication management and therapy services.  Advised patient about FMLA/short-term mental health leave.  Patient became tearful stating " I am not going back to the work, after what I been put through."  She provided verbal authorization to follow-up with her son [name] who she resides with for additional collateral. ? ?[name] denied illicit drug use or substance abuse history.  States she is followed by counselor unsure of last follow-up date.  Declined additional outpatient resources during this assessment.  Will recommend overnight observation with reevaluation by psychiatry.  ? ?NP and TTS counselor spoke to patient's son [name]) for additional collateral who denied any safety concerns with patient returning home.  Denies that patient has access to guns or weapons.  Does report concerns for overall mental health as he reports that before/separation has been  tormenting his mother for the past 2 or so months.  States his younger sister was currently inpatient at [facility].  1 month ago. ? ? ?PHQ 2-9:   ?[facility] ED from 07/11/2021 in [facility]  ?C-SSRS RISK CATEGORY No Risk  ? ?  ?  ? ?Total Time spent with patient: 15 minutes ? ?Musculoskeletal  ?Strength & Muscle Tone: within normal limits ?Gait & Station: normal ?Patient leans: N/A ? ?Psychiatric Specialty Exam  ?Presentation ?General Appearance: Appropriate for Environment ? ?Eye Contact:Good ? ?Speech:Clear and Coherent ? ?Speech Volume:Normal ? ?Handedness:Right ? ? ?Mood and Affect  ?Mood:Anxious; Depressed ? ?Affect:Congruent ? ? ?Thought Process  ?Thought Processes:Coherent ? ?Descriptions of Associations:Intact ? ?Orientation:Full (Time, Place and Person) ? ?Thought Content:Logical ? Diagnosis of Schizophrenia or Schizoaffective disorder in past: No ?  ?Hallucinations:Hallucinations: None ? ?Ideas of Reference:None ? ?Suicidal Thoughts:Suicidal Thoughts: Yes, Passive ?SI Passive Intent and/or Plan: Without Intent ? ?Homicidal Thoughts:Homicidal Thoughts: No ? ? ?Sensorium  ?Memory:Immediate Fair; Recent Fair; Remote Fair ? ?Judgment:Fair ? ?Insight:Good ? ? ?Executive Functions  ?Concentration:Fair ? ?Attention Span:Good ? ?Recall:Good ? ?Fund of [place] ? ?Language:Fair ? ? ?Psychomotor Activity  ?Psychomotor Activity:Psychomotor Activity: Normal ? ? ?Assets  ?Assets:Desire for Improvement ? ? ?Sleep  ?Sleep:Sleep: Fair ? ? ?Nutritional Assessment (For OBS and FBC admissions only) ?Has the patient had a weight loss or gain of 10 pounds or more in the last 3 months?: No ?Has the patient had a decrease in food intake/or appetite?: No ?Does the patient have dental problems?: No ?Has the patient recently lost weight without trying?: 0 ?Has the patient been eating poorly because of a decreased appetite?: 0 ?  Malnutrition Screening Tool Score: 0 ? ? ? ?Physical  Exam ?Vitals and nursing note reviewed.  ?Cardiovascular:  ?   Rate and Rhythm: Normal rate and regular rhythm.  ?Neurological:  ?   Mental Status: She is oriented to person, place, and time.  ?Psychiatric:     ?   Attention and Perception: Attention normal.     ?   Mood and Affect: Mood normal.     ?   Speech: Speech normal.     ?   Behavior: Behavior normal.     ?   Thought Content: Thought content normal. Suicidal: passive ideaitons.     ?   Judgment: Judgment normal.  ? ?Review of Systems  ?Psychiatric/Behavioral:  Positive for depression. Suicidal ideas: passive ideations.The patient is nervous/anxious.   ?All other systems reviewed and are negative. ? ?Blood pressure (!) 154/98, pulse 70, temperature 98 ?F (36.7 ?C), temperature source Oral, resp. rate 18, SpO2 97 %. There is no height or weight on file to calculate BMI. ? ?Past Psychiatric History:  ? ?Is the patient at risk to self? Yes  ?Has the patient been a risk to self in the past 6 months? No .    ?Has the patient been a risk to self within the distant past? No   ?Is the patient a risk to others? No   ?Has the patient been a risk to others in the past 6 months? No   ?Has the patient been a risk to others within the distant past? No  ? ?Past Medical History:  ?Past Medical History:  ?Diagnosis Date  ? Family history of osteoporosis in mother 04/09/2019  ? 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  ?Past Surgical History:  ?Procedure Laterality Date  ? TOTAL HIP ARTHROPLASTY Left 03/02/2020  ? Dr. Latanya Maudlin  ? ? ?Family History:  ?Family History  ?Problem Relation Age of Onset  ? Alcohol abuse Mother   ? Osteoporosis Mother   ? High blood pressure Father   ? Alcohol abuse Brother   ? COPD Maternal Grandmother   ? Cancer Paternal Grandmother   ? Stroke Paternal Grandfather   ? Healthy Daughter   ? Healthy Son   ? ? ?Social History:  ?Social History  ? ?Socioeconomic History  ? Marital status: Married  ?  Spouse name: Not on file  ? Number of children:  Not on file  ? Years of education: Not on file  ? Highest education level: Not on file  ?Occupational History  ? Occupation: Pharmacist, hospital, [employer]  ?Tobacco Use  ? Smoking status: Never  ? Smokeless tobacco: Never  ?Vaping Use  ? Vaping Use: Never used  ?Substance and Sexual Activity  ? Alcohol use: Yes  ?  Comment: beer or wine - seldom use  ? Drug use: Never  ? Sexual activity: Yes  ?  Partners: Male  ?  Birth control/protection: Post-menopausal  ?Other Topics Concern  ? Not on file  ?Social History Narrative  ? Not on file  ? ?Social Determinants of Health  ? ?Financial Resource Strain: Not on file  ?Food Insecurity: Not on file  ?Transportation Needs: Not on file  ?Physical Activity: Not on file  ?Stress: Not on file  ?Social Connections: Not on file  ?Intimate Partner Violence: Not on file  ? ? ?SDOH:  ?SDOH Screenings  ? ?Alcohol Screen: Not on file  ?Depression (PHQ2-9): Not on file  ?Financial Resource Strain: Not on file  ?Food Insecurity: Not  on file  ?Housing: Not on file  ?Physical Activity: Not on file  ?Social Connections: Not on file  ?Stress: Not on file  ?Tobacco Use: Low Risk   ? Smoking Tobacco Use: Never  ? Smokeless Tobacco Use: Never  ? Passive Exposure: Not on file  ?Transportation Needs: Not on file  ? ? ?Last Labs:  ?No visits with results within 6 Month(s) from this visit.  ?Latest known visit with results is:  ?Office Visit on 07/10/2020  ?Component Date Value Ref Range Status  ? Chromium 07/10/2020 <1.0  <3.0 ng/mL Final  ? Cobalt 07/10/2020 <1.0  <3.0 ng/mL Final  ? Comment: Chromium and cobalt analysis performed by inductively coupled ?plasma/mass spectrometry (ICP/MS). ? ?Reference range is for patients with metal-on-metal (MoM) ?orthopedic implants. ? ?The American Association of Hip and Knee Surgeons, the American ?Academy of Orthopaedic Surgeons, and The Hip Society, have published ?a consensus statement, "Risk Stratification Algorithm for Management ?of Patients with  Metal-on-Metal Hip Arthroplasty." The algorithm is ?intended as an aid to orthopedic surgeons in the assessment and ?management of patients with Metal-on-Metal bearings. ? ?The systematic risk stratification inc

## 2021-07-11 NOTE — ED Notes (Signed)
Patient with flat affect, denies SI,HI, AVH at this time - says she "just had a bad day." Patient reading and denies needs right now, will continue to monitor for safety ?

## 2021-07-11 NOTE — BH Assessment (Signed)
Kelly [name] is urgent. Going through divorce and made suicidal comments to her attorney today after receiving disturbing information. Pt denies SI, HI, AVH currently. Her son is in the lobby for collateral if needed.  ?
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
## 2021-07-11 NOTE — Discharge Instructions (Addendum)
Take all medications as prescribed. ?Keep all follow-up appointments as scheduled.  ?Do not consume alcohol or use illegal drugs while on prescription medications. ?Report any adverse effects from your medications to your primary care provider promptly.  ?In the event of recurrent symptoms or worsening symptoms, call 911, a crisis hotline, or go to the nearest emergency department for evaluation.   ? ? ? ?For your behavioral health needs, you are advised to follow up with the Partial Hospitalization Program (PHP) offered through [facility].  This program meets Monday - Friday from 9:00 am - 1:00 pm.  Due to Covid-19 this program is currently virtual.  You are scheduled for a virtual intake appointment tomorrow, Friday, July 13, 2021 at 10:00 am.  Plan for the appointment to take about an hour.  If you have any questions, contact Lorin Glass, LCSW or Moses Manners, LCSW-A at the phone number indicated below: ? ?     [facility] ?     [address] ?     [place], [place] [address] ?     Contact person: Lorin Glass, LCSW or Moses Manners, [place] ?     [phone]  ?

## 2021-07-12 ENCOUNTER — Telehealth: Payer: Self-pay | Admitting: Family Medicine

## 2021-07-12 NOTE — ED Notes (Signed)
Patient has refused breakfast. Patient denied SI/HI and AVH. Patient has been sitting up reading this morning. Patient reported she was ready to go home.  ?

## 2021-07-12 NOTE — BH Assessment (Signed)
[place] Assessment Progress Note ?  ?Per Beatriz Stallion, NP, this pt does not require psychiatric hospitalization at this time.  Pt is psychiatrically cleared.  Pt would benefit from Partial Hospitalization Program at the [facility], which is currently being offered virtually due to Covid-19.  I have reached out to [facility], LCSW-A, who has scheduled pt for a virtual intake appointment tomorrow, Friday, 07/13/21 at 10:00 am. This has been included in pt's discharge instructions.  Otila Kluver has been notified. ? ?Jalene Mullet, MA ?Triage Specialist ?[phone]  ?
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
## 2021-07-12 NOTE — ED Notes (Signed)
Patient continues to rest with no sxs of distress - will continue to monitor for safety ?

## 2021-07-12 NOTE — ED Notes (Signed)
Pt was offered breakfast and juice. Pt refused. ?

## 2021-07-12 NOTE — ED Notes (Signed)
Patient was discharged to home by provider. Patient denies SI/HI and AVS. Patient denies pain. Patient was escorted and transported home by her son. Patient received his AVS with discharge instructions with follow-up appointments.  ?

## 2021-07-12 NOTE — Telephone Encounter (Signed)
Dr. Jonni Sanger, please call back as soon as possible. ?

## 2021-07-13 ENCOUNTER — Ambulatory Visit (HOSPITAL_COMMUNITY): Payer: No Payment, Other | Admitting: Licensed Clinical Social Worker

## 2021-07-13 DIAGNOSIS — F4323 Adjustment disorder with mixed anxiety and depressed mood: Secondary | ICD-10-CM | POA: Insufficient documentation

## 2021-07-13 NOTE — Telephone Encounter (Signed)
Spoke with mom
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
## 2021-08-14 ENCOUNTER — Telehealth: Payer: Self-pay

## 2021-08-14 NOTE — Telephone Encounter (Signed)
Patient states she is moving out of [place] in August and would like to get a CPE in before she moves.  Would Dr. Jonni Sanger be able to work patient in sooner?

## 2021-08-17 ENCOUNTER — Encounter: Payer: Self-pay | Admitting: Family Medicine

## 2021-08-17 ENCOUNTER — Ambulatory Visit (INDEPENDENT_AMBULATORY_CARE_PROVIDER_SITE_OTHER): Payer: Commercial Managed Care - PPO | Admitting: Family Medicine

## 2021-08-17 VITALS — BP 108/70 | HR 67 | Temp 99.2°F | Ht 68.0 in | Wt 139.8 lb

## 2021-08-17 DIAGNOSIS — Z1231 Encounter for screening mammogram for malignant neoplasm of breast: Secondary | ICD-10-CM

## 2021-08-17 DIAGNOSIS — Z23 Encounter for immunization: Secondary | ICD-10-CM

## 2021-08-17 DIAGNOSIS — Z0000 Encounter for general adult medical examination without abnormal findings: Secondary | ICD-10-CM | POA: Diagnosis not present

## 2021-08-17 DIAGNOSIS — Z96642 Presence of left artificial hip joint: Secondary | ICD-10-CM | POA: Diagnosis not present

## 2021-08-17 LAB — CBC WITH DIFFERENTIAL/PLATELET
Basophils Absolute: 0 10*3/uL (ref 0.0–0.1)
Basophils Relative: 0.7 % (ref 0.0–3.0)
Eosinophils Absolute: 0.1 10*3/uL (ref 0.0–0.7)
Eosinophils Relative: 2.6 % (ref 0.0–5.0)
HCT: 39.8 % (ref 36.0–46.0)
Hemoglobin: 13.4 g/dL (ref 12.0–15.0)
Lymphocytes Relative: 27.7 % (ref 12.0–46.0)
Lymphs Abs: 1.6 10*3/uL (ref 0.7–4.0)
MCHC: 33.7 g/dL (ref 30.0–36.0)
MCV: 92.7 fl (ref 78.0–100.0)
Monocytes Absolute: 0.4 10*3/uL (ref 0.1–1.0)
Monocytes Relative: 7.4 % (ref 3.0–12.0)
Neutro Abs: 3.6 10*3/uL (ref 1.4–7.7)
Neutrophils Relative %: 61.6 % (ref 43.0–77.0)
Platelets: 262 10*3/uL (ref 150.0–400.0)
RBC: 4.29 Mil/uL (ref 3.87–5.11)
RDW: 13 % (ref 11.5–15.5)
WBC: 5.8 10*3/uL (ref 4.0–10.5)

## 2021-08-17 LAB — LIPID PANEL
Cholesterol: 201 mg/dL — ABNORMAL HIGH (ref 0–200)
HDL: 98.1 mg/dL (ref >39.00)
LDL Cholesterol: 88 mg/dL (ref 0–99)
NonHDL: 102.88
Total CHOL/HDL Ratio: 2
Triglycerides: 72 mg/dL (ref 0.0–149.0)
VLDL: 14.4 mg/dL (ref 0.0–40.0)

## 2021-08-17 LAB — COMPREHENSIVE METABOLIC PANEL
ALT: 16 U/L (ref 0–35)
AST: 18 U/L (ref 0–37)
Albumin: 4.2 g/dL (ref 3.5–5.2)
Alkaline Phosphatase: 49 U/L (ref 39–117)
BUN: 17 mg/dL (ref 6–23)
CO2: 32 mEq/L (ref 19–32)
Calcium: 10 mg/dL (ref 8.4–10.5)
Chloride: 101 mEq/L (ref 96–112)
Creatinine, Ser: 0.93 mg/dL (ref 0.40–1.20)
GFR: 70.67 mL/min (ref >60.00)
Glucose, Bld: 66 mg/dL — ABNORMAL LOW (ref 70–99)
Potassium: 4.1 mEq/L (ref 3.5–5.1)
Sodium: 141 mEq/L (ref 135–145)
Total Bilirubin: 1.4 mg/dL — ABNORMAL HIGH (ref 0.2–1.2)
Total Protein: 6.7 g/dL (ref 6.0–8.3)

## 2021-08-17 LAB — TSH: TSH: 1.7 u[IU]/mL (ref 0.35–5.50)

## 2021-08-17 MED ORDER — MELOXICAM 15 MG PO TABS: 15.0000 mg | ORAL_TABLET | Freq: Every day | ORAL | 2 refills | Status: DC | PRN

## 2021-08-17 NOTE — Patient Instructions (Signed)
Please return in 12 months for your annual complete physical; please come fasting. Schedule a nurse visit in 2-6 months for your 2nd shingrix vaccination. You received your 1st today.  I will release your lab results to you on your MyChart account with further instructions. You may see the results before I do, but when I review them I will send you a message with my report or have my assistant call you if things need to be discussed. Please reply to my message with any questions. Thank you!   If you have any questions or concerns, please don't hesitate to send me a message via MyChart or call the office at [phone]. Thank you for visiting with [place] today! It's our pleasure caring for you.   I have ordered a mammogram and/or bone density for you as we discussed today: '[x]'$   Mammogram  '[]'$   Bone Density  Please call the office checked below to schedule your appointment:  '[]'$   [facility]      [address] [place], [place]         '[]'$   [facility]  [address] [place], [place]

## 2021-08-17 NOTE — Progress Notes (Signed)
Subjective  Chief Complaint  Patient presents with   Annual Exam    Pt here for Annual exam and is not currently fasting     HPI: Kelly [name] is a 51 y.o. female who presents to [facility] today for a Female Wellness Visit.   Wellness Visit: annual visit with health maintenance review and exam without Pap  Health maintenance: Due for mammogram.  Cologuard for colorectal cancer screening current.  Next due 2025.  Eligible for Shingrix and would like to get today.  Healthy lifestyle.  Going through her divorce.  It has been a struggle.  However she was feeling well about things now.  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  Moving to [place] to complete that. Chronic hip pain status post replacement: Responsive to meloxicam.  Request refill.  Rare use.  Assessment  1. Annual physical exam   2. Encounter for screening mammogram for breast cancer   3. Need for shingles vaccine   4. Status post total hip replacement, left      Plan  Female Wellness Visit: Age appropriate Health Maintenance and Prevention measures were discussed with patient. Included topics are cancer screening recommendations, ways to keep healthy (see AVS) including dietary and exercise recommendations, regular eye and dental care, use of seat belts, and avoidance of moderate alcohol use and tobacco use.  Mammogram. BMI: discussed patient's BMI and encouraged positive lifestyle modifications to help get to or maintain a target BMI. HM needs and immunizations were addressed and ordered. See below for orders. See HM and immunization section for updates.  First shingles today.  Return in 2 to 6 months for second dose. Routine labs and screening tests ordered including cmp, cbc and lipids where appropriate. Discussed recommendations regarding Vit D and calcium supplementation (see AVS) Meloxicam as needed for hip pain.  Follow up: 12 months for complete  physical.  Orders Placed This Encounter  Procedures   MM DIGITAL SCREENING BILATERAL   Varicella-zoster vaccine IM (Shingrix)   CBC with Differential/Platelet   Comprehensive metabolic panel   Lipid panel   TSH   Meds ordered this encounter  Medications   meloxicam (MOBIC) 15 MG tablet    Sig: Take 1 tablet (15 mg total) by mouth daily as needed for pain.    Dispense:  30 tablet    Refill:  2      Body mass index is 21.26 kg/m. Wt Readings from Last 3 Encounters:  08/17/21 139 lb 12.8 oz (63.4 kg)  09/21/20 143 lb 4 oz (65 kg)  07/10/20 148 lb (67.1 kg)     Patient Active Problem List   Diagnosis Date Noted   Adjustment disorder with mixed anxiety and depressed mood 07/13/2021   Status post total hip replacement, left 04/10/2020   Pars defect with spondylolisthesis 06/22/2019   Family history of osteoporosis in mother 04/09/2019   Health Maintenance  Topic Date Due   MAMMOGRAM  07/04/2020   COVID-19 Vaccine (4 - Booster for [place] series) 09/02/2021 (Originally 02/18/2020)   Zoster Vaccines- Shingrix (2 of 2) 10/12/2021   PAP SMEAR-Modifier  04/03/2023   Fecal DNA (Cologuard)  06/01/2023   TETANUS/TDAP  04/08/2028   Hepatitis C Screening  Completed   HIV Screening  Completed   HPV VACCINES  Aged Out   INFLUENZA VACCINE  Discontinued   Immunization History  Administered Date(s) Administered   Influenza,inj,Quad PF,6+ Mos 04/09/2019   PFIZER(Purple Top)SARS-COV-2 Vaccination 05/13/2019,  06/04/2019, 12/24/2019   Tdap 04/08/2018   Zoster Recombinat (Shingrix) 08/17/2021   We updated and reviewed the patient's past history in detail and it is documented below. Allergies: Patient has No Known Allergies. Past Medical History Patient  has a past medical history of Family history of osteoporosis in mother (04/09/2019) and History of left hip replacement (03/02/2020). Past Surgical History Patient  has a past surgical history that includes Total hip arthroplasty  (Left, 03/02/2020). Family History: Patient family history includes Alcohol abuse in her brother and mother; COPD in her maternal grandmother; Cancer in her paternal grandmother; Healthy in her daughter and son; High blood pressure in her father; Osteoporosis in her mother; Stroke in her paternal grandfather. Social History:  Patient  reports that she has never smoked. She has never used smokeless tobacco. She reports current alcohol use. She reports that she does not use drugs.  Review of Systems: Constitutional: negative for fever or malaise Ophthalmic: negative for photophobia, double vision or loss of vision Cardiovascular: negative for chest pain, dyspnea on exertion, or new LE swelling Respiratory: negative for SOB or persistent cough Gastrointestinal: negative for abdominal pain, change in bowel habits or melena Genitourinary: negative for dysuria or gross hematuria, no abnormal uterine bleeding or disharge Musculoskeletal: negative for new gait disturbance or muscular weakness Integumentary: negative for new or persistent rashes, no breast lumps Neurological: negative for TIA or stroke symptoms Psychiatric: negative for SI or delusions Allergic/Immunologic: negative for hives  Patient Care Team    Relationship Specialty Notifications Start End  Leamon Arnt, MD PCP - General Family Medicine  04/02/18   Melrose Nakayama, MD Consulting Physician Orthopedic Surgery  04/10/20     Objective  Vitals: BP 108/70   Pulse 67   Temp 99.2 F (37.3 C)   Ht '5\' 8"'$  (1.727 m)   Wt 139 lb 12.8 oz (63.4 kg)   SpO2 97%   BMI 21.26 kg/m  General:  Well developed, well nourished, no acute distress  Psych:  Alert and orientedx3,normal mood and affect HEENT:  Normocephalic, atraumatic, non-icteric sclera, supple neck without adenopathy, mass or thyromegaly Cardiovascular:  Normal S1, S2, RRR without gallop, rub or murmur Respiratory:  Good breath sounds bilaterally, CTAB with normal  respiratory effort Gastrointestinal: normal bowel sounds, soft, non-tender, no noted masses. No HSM MSK: no deformities, contusions. Joints are without erythema or swelling.  Skin:  Warm, no rashes or suspicious lesions noted Neurologic:    Mental status is normal. CN 2-11 are normal. Gross motor and sensory exams are normal. Normal gait. No tremor  Commons side effects, risks, benefits, and alternatives for medications and treatment plan prescribed today were discussed, and the patient expressed understanding of the given instructions. Patient is instructed to call or message via MyChart if he/she has any questions or concerns regarding our treatment plan. No barriers to understanding were identified. We discussed Red Flag symptoms and signs in detail. Patient expressed understanding regarding what to do in case of urgent or emergency type symptoms.  Medication list was reconciled, printed and provided to the patient in AVS. Patient instructions and summary information was reviewed with the patient as documented in the AVS. This note was prepared with assistance of Dragon voice recognition software. Occasional wrong-word or sound-a-like substitutions may have occurred due to the inherent limitations of voice recognition software  This visit occurred during the SARS-CoV-2 public health emergency.  Safety protocols were in place, including screening questions prior to the visit, additional usage of staff PPE,  and extensive cleaning of exam room while observing appropriate contact time as indicated for disinfecting solutions.

## 2021-08-24 ENCOUNTER — Ambulatory Visit: Payer: Commercial Managed Care - PPO | Admitting: Family Medicine

## 2021-09-07 ENCOUNTER — Ambulatory Visit: Payer: Commercial Managed Care - PPO

## 2021-09-10 ENCOUNTER — Ambulatory Visit
Admission: RE | Admit: 2021-09-10 | Discharge: 2021-09-10 | Disposition: A | Discharge status: Home / Self Care | Payer: Commercial Managed Care - PPO | Source: Ambulatory Visit | Attending: Family Medicine | Admitting: Family Medicine

## 2021-09-10 DIAGNOSIS — Z1231 Encounter for screening mammogram for malignant neoplasm of breast: Secondary | ICD-10-CM

## 2021-09-10 DIAGNOSIS — Z0000 Encounter for general adult medical examination without abnormal findings: Secondary | ICD-10-CM

## 2021-09-17 ENCOUNTER — Encounter: Payer: Self-pay | Admitting: Family Medicine

## 2021-09-17 ENCOUNTER — Ambulatory Visit: Payer: Commercial Managed Care - PPO | Admitting: Family Medicine

## 2021-09-17 VITALS — BP 112/74 | HR 71 | Temp 98.0°F | Ht 68.0 in | Wt 142.4 lb

## 2021-09-17 DIAGNOSIS — Z029 Encounter for administrative examinations, unspecified: Secondary | ICD-10-CM

## 2021-09-17 DIAGNOSIS — Z0184 Encounter for antibody response examination: Secondary | ICD-10-CM | POA: Diagnosis not present

## 2021-09-17 DIAGNOSIS — Z111 Encounter for screening for respiratory tuberculosis: Secondary | ICD-10-CM

## 2021-09-17 LAB — POCT URINALYSIS DIPSTICK
Bilirubin, UA: NEGATIVE
Blood, UA: NEGATIVE
Glucose, UA: NEGATIVE
Ketones, UA: POSITIVE
Nitrite, UA: NEGATIVE
Protein, UA: NEGATIVE
Spec Grav, UA: 1.01 (ref 1.010–1.025)
Urobilinogen, UA: 0.2 E.U./dL
pH, UA: 7 (ref 5.0–8.0)

## 2021-09-17 NOTE — Patient Instructions (Signed)
Please return in [place] for physical.  We will call you with results and to pick up your paperwork.   If you have any questions or concerns, please don't hesitate to send me a message via MyChart or call the office at [phone]. Thank you for visiting with [place] today! It's our pleasure caring for you.

## 2021-09-17 NOTE — Progress Notes (Signed)
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    HPI: Kelly [name] is a 51 y.o. female who presents to the office today to address the problems listed above in the chief complaint. Referred to nursing school physical form.  Reviewed requirements for immunizations and testing.  Refer to physical done last month.  Healthy.  Active. No copy of her childhood immunization record. School requires Tdap, titers for MMR, hepatitis B, varicella.  TB screen.  Urinalysis.  Assessment  1. Encounters for administrative purposes   2. Immunity status testing   3. Screening-pulmonary TB      Plan  Await titers update immunizations as needed.  TB screening done.  Patient to return once forms are all completed.  Follow up: As scheduled 10/17/2021  Orders Placed This Encounter  Procedures   Measles/Mumps/Rubella Immunity   Hepatitis B surface antibody,qualitative   Varicella-Zoster Virus Antibody (Immunity Screen), ACIF ,Serum   QuantiFERON-TB Gold Plus   POCT urinalysis dipstick   No orders of the defined types were placed in this encounter.     I reviewed the patients updated PMH, FH, and SocHx.    Patient Active Problem List   Diagnosis Date Noted   Adjustment disorder with mixed anxiety and depressed mood 07/13/2021   Status post total hip replacement, left 04/10/2020   Pars defect with spondylolisthesis 06/22/2019   Family history of osteoporosis in mother 04/09/2019   Current Meds  Medication Sig   Cholecalciferol (VITAMIN D-3 PO) Take 1 tablet by mouth daily.   FERROUS GLUCONATE PO Take 1 tablet by mouth every other day.   GLUCOSAMINE-CHONDROITIN PO Take 1 capsule by mouth daily.   meloxicam (MOBIC) 15 MG tablet Take 1 tablet (15 mg total) by mouth daily as needed for pain.   Multiple Vitamin (MULTIVITAMIN WITH MINERALS) TABS tablet Take 1 tablet by mouth daily.    Allergies: Patient has No Known  Allergies. Family History: Patient family history includes Alcohol abuse in her brother and mother; COPD in her maternal grandmother; Cancer in her paternal grandmother; Healthy in her daughter and son; High blood pressure in her father; Osteoporosis in her mother; Stroke in her paternal grandfather. Social History:  Patient  reports that she has never smoked. She has never used smokeless tobacco. She reports current alcohol use. She reports that she does not use drugs.  Review of Systems: Constitutional: Negative for fever malaise or anorexia Cardiovascular: negative for chest pain Respiratory: negative for SOB or persistent cough Gastrointestinal: negative for abdominal pain  Objective  Vitals: BP 112/74   Pulse 71   Temp 98 F (36.7 C)   Ht 5' 8"  (1.727 m)   Wt 142 lb 6.4 oz (64.6 kg)   SpO2 99%   BMI 21.65 kg/m  General: no acute distress , A&Ox3   Commons side effects, risks, benefits, and alternatives for medications and treatment plan prescribed today were discussed, and the patient expressed understanding of the given instructions. Patient is instructed to call or message via MyChart if he/she has any questions or concerns regarding our treatment plan. No barriers to understanding were identified. We discussed Red Flag symptoms and signs in detail. Patient expressed understanding regarding what to do in case of urgent or emergency type symptoms.  Medication list was reconciled, printed and provided to the patient in AVS. Patient instructions and summary information was reviewed with the patient as documented in the  AVS. This note was prepared with assistance of Dragon voice recognition software. Occasional wrong-word or sound-a-like substitutions may have occurred due to the inherent limitations of voice recognition software  This visit occurred during the SARS-CoV-2 public health emergency.  Safety protocols were in place, including screening questions prior to the visit,  additional usage of staff PPE, and extensive cleaning of exam room while observing appropriate contact time as indicated for disinfecting solutions.

## 2021-09-23 LAB — MEASLES/MUMPS/RUBELLA IMMUNITY
Mumps IgG: 91.2 AU/mL
Rubella: >33 Index
Rubeola IgG: 33.6 AU/mL

## 2021-09-23 LAB — QUANTIFERON-TB GOLD PLUS
Mitogen-NIL: >10 IU/mL
NIL: 0.13 IU/mL
QuantiFERON-TB Gold Plus: NEGATIVE
TB1-NIL: 0.05 IU/mL
TB2-NIL: 0.11 IU/mL

## 2021-09-23 LAB — VARICELLA-ZOSTER VIRUS AB(IMMUNITY SCREEN),ACIF,SERUM: VARICELLA ZOSTER VIRUS AB (IMMUNITY SCR),ACIF SERUM: >=1:4 {titer}

## 2021-09-23 LAB — HEPATITIS B SURFACE ANTIBODY,QUALITATIVE: Hep B S Ab: REACTIVE — AB

## 2021-09-24 NOTE — Progress Notes (Signed)
Please call patient: I have reviewed his/her lab results. Labs are all showing immunity. I can complete her forms and then she can be notified ready for pick up.

## 2021-10-08 NOTE — Telephone Encounter (Signed)
Rescheduled pt to 10/24/21 at 9:45.

## 2021-10-17 ENCOUNTER — Ambulatory Visit: Payer: Commercial Managed Care - PPO

## 2021-10-24 ENCOUNTER — Ambulatory Visit (INDEPENDENT_AMBULATORY_CARE_PROVIDER_SITE_OTHER): Payer: Commercial Managed Care - PPO

## 2021-10-24 DIAGNOSIS — Z23 Encounter for immunization: Secondary | ICD-10-CM

## 2021-10-24 NOTE — Progress Notes (Signed)
Pt here for 2nd Shingles vaccine; injection was given in left deltoid and pt tolerated well. Pt states no side effects to last Shingles vaccine and generally tolerates well.

## 2021-11-16 ENCOUNTER — Encounter: Payer: Commercial Managed Care - PPO | Admitting: Family Medicine

## 2021-12-01 ENCOUNTER — Other Ambulatory Visit: Payer: Self-pay | Admitting: Family Medicine

## 2021-12-29 ENCOUNTER — Other Ambulatory Visit: Payer: Self-pay | Admitting: Family Medicine

## 2022-01-25 ENCOUNTER — Telehealth: Payer: Commercial Managed Care - PPO | Admitting: Physician Assistant

## 2022-01-25 DIAGNOSIS — R21 Rash and other nonspecific skin eruption: Secondary | ICD-10-CM

## 2022-01-25 NOTE — Progress Notes (Signed)
Because you have never been officially diagnosed and with possible infidelity, I feel your condition warrants further evaluation and I recommend that you be seen in a face to face visit for official testing and further evaluation.   NOTE: There will be NO CHARGE for this eVisit   If you are having a true medical emergency please call 911.      For an urgent face to face visit, [facility] has seven urgent care centers for your convenience:     [facility] at  Get Driving Directions [phone] [place], [place] [address]    [facility]) Get Driving Directions [phone] [place], [place] [address]  [facility] ([place]) Get Driving Directions [phone] [address] [place],  [place]  [address]  [facility] [place] [facility] - at [address] Get Driving Directions  [phone] [address] [place],  [place] [address]   [facility] Get Driving Directions [phone] [address] [place], [place], [place] [address]   [facility] Get Driving Directions  [phone] [address] [place], [place] [address]   [facility] [place] Get Driving Directions [phone] [address] [place],  [address]  Your MyChart E-visit questionnaire answers were reviewed by a board certified advanced clinical practitioner to complete your personal care plan based on your specific symptoms.  Thank you for using e-Visits.   I have spent 5 minutes in review of e-visit questionnaire, review and updating patient chart, medical decision making and response to patient.   Mar Daring, PA-C

## 2022-01-27 ENCOUNTER — Encounter: Payer: Self-pay | Admitting: Family Medicine

## 2022-02-02 ENCOUNTER — Other Ambulatory Visit: Payer: Self-pay | Admitting: Family Medicine

## 2022-03-10 ENCOUNTER — Other Ambulatory Visit: Payer: Self-pay | Admitting: Family Medicine

## 2022-07-23 ENCOUNTER — Ambulatory Visit: Payer: Self-pay | Admitting: Family Medicine

## 2022-08-21 ENCOUNTER — Encounter: Payer: Commercial Managed Care - PPO | Admitting: Family Medicine

## 2022-08-27 ENCOUNTER — Ambulatory Visit: Payer: Self-pay | Admitting: Family Medicine

## 2022-09-12 ENCOUNTER — Encounter: Payer: Self-pay | Admitting: Family Medicine

## 2023-05-06 ENCOUNTER — Other Ambulatory Visit (HOSPITAL_COMMUNITY)
Admission: RE | Admit: 2023-05-06 | Discharge: 2023-05-06 | Disposition: A | Discharge status: Home / Self Care | Payer: Managed Care, Other (non HMO) | Source: Ambulatory Visit | Attending: Family Medicine | Admitting: Family Medicine

## 2023-05-06 ENCOUNTER — Ambulatory Visit: Payer: Managed Care, Other (non HMO) | Admitting: Family Medicine

## 2023-05-06 ENCOUNTER — Encounter: Payer: Self-pay | Admitting: Family Medicine

## 2023-05-06 VITALS — BP 135/86 | HR 60 | Temp 97.7°F | Ht 68.0 in | Wt 143.6 lb

## 2023-05-06 DIAGNOSIS — Z1211 Encounter for screening for malignant neoplasm of colon: Secondary | ICD-10-CM

## 2023-05-06 DIAGNOSIS — Z13220 Encounter for screening for lipoid disorders: Secondary | ICD-10-CM

## 2023-05-06 DIAGNOSIS — Z0000 Encounter for general adult medical examination without abnormal findings: Secondary | ICD-10-CM

## 2023-05-06 DIAGNOSIS — Z96642 Presence of left artificial hip joint: Secondary | ICD-10-CM

## 2023-05-06 DIAGNOSIS — B0089 Other herpesviral infection: Secondary | ICD-10-CM | POA: Diagnosis not present

## 2023-05-06 DIAGNOSIS — Z1231 Encounter for screening mammogram for malignant neoplasm of breast: Secondary | ICD-10-CM

## 2023-05-06 DIAGNOSIS — Z124 Encounter for screening for malignant neoplasm of cervix: Secondary | ICD-10-CM | POA: Insufficient documentation

## 2023-05-06 HISTORY — DX: Other herpesviral infection: B00.89

## 2023-05-06 LAB — CBC WITH DIFFERENTIAL/PLATELET
Basophils Absolute: 0 10*3/uL (ref 0.0–0.1)
Basophils Relative: 0.7 % (ref 0.0–3.0)
Eosinophils Absolute: 0.1 10*3/uL (ref 0.0–0.7)
Eosinophils Relative: 1.5 % (ref 0.0–5.0)
HCT: 41.5 % (ref 36.0–46.0)
Hemoglobin: 13.7 g/dL (ref 12.0–15.0)
Lymphocytes Relative: 29.3 % (ref 12.0–46.0)
Lymphs Abs: 1.7 10*3/uL (ref 0.7–4.0)
MCHC: 33.1 g/dL (ref 30.0–36.0)
MCV: 95.7 fL (ref 78.0–100.0)
Monocytes Absolute: 0.4 10*3/uL (ref 0.1–1.0)
Monocytes Relative: 7.4 % (ref 3.0–12.0)
Neutro Abs: 3.5 10*3/uL (ref 1.4–7.7)
Neutrophils Relative %: 61.1 % (ref 43.0–77.0)
Platelets: 295 10*3/uL (ref 150.0–400.0)
RBC: 4.33 Mil/uL (ref 3.87–5.11)
RDW: 12.7 % (ref 11.5–15.5)
WBC: 5.8 10*3/uL (ref 4.0–10.5)

## 2023-05-06 LAB — LIPID PANEL
Cholesterol: 213 mg/dL — ABNORMAL HIGH (ref 0–200)
HDL: 109.5 mg/dL (ref >39.00)
LDL Cholesterol: 92 mg/dL (ref 0–99)
NonHDL: 103.39
Total CHOL/HDL Ratio: 2
Triglycerides: 56 mg/dL (ref 0.0–149.0)
VLDL: 11.2 mg/dL (ref 0.0–40.0)

## 2023-05-06 LAB — COMPREHENSIVE METABOLIC PANEL
ALT: 16 U/L (ref 0–35)
AST: 20 U/L (ref 0–37)
Albumin: 4.7 g/dL (ref 3.5–5.2)
Alkaline Phosphatase: 68 U/L (ref 39–117)
BUN: 14 mg/dL (ref 6–23)
CO2: 32 meq/L (ref 19–32)
Calcium: 9.5 mg/dL (ref 8.4–10.5)
Chloride: 100 meq/L (ref 96–112)
Creatinine, Ser: 0.93 mg/dL (ref 0.40–1.20)
GFR: 69.82 mL/min (ref >60.00)
Glucose, Bld: 81 mg/dL (ref 70–99)
Potassium: 3.5 meq/L (ref 3.5–5.1)
Sodium: 140 meq/L (ref 135–145)
Total Bilirubin: 1.3 mg/dL — ABNORMAL HIGH (ref 0.2–1.2)
Total Protein: 7.5 g/dL (ref 6.0–8.3)

## 2023-05-06 LAB — TSH: TSH: 1.32 u[IU]/mL (ref 0.35–5.50)

## 2023-05-06 MED ORDER — MELOXICAM 15 MG PO TABS: 15.0000 mg | ORAL_TABLET | Freq: Every day | ORAL | 0 refills | Status: AC | PRN

## 2023-05-06 MED ORDER — VALACYCLOVIR HCL 500 MG PO TABS: 500.0000 mg | ORAL_TABLET | Freq: Two times a day (BID) | ORAL | 1 refills | Status: AC

## 2023-05-06 MED ORDER — TIZANIDINE HCL 4 MG PO TABS: 4.0000 mg | ORAL_TABLET | Freq: Three times a day (TID) | ORAL | 0 refills | Status: AC | PRN

## 2023-05-06 NOTE — Patient Instructions (Signed)
Please return in 12 months for your annual complete physical; please come fasting.   I will release your lab results to you on your MyChart account with further instructions. You may see the results before I do, but when I review them I will send you a message with my report or have my assistant call you if things need to be discussed. Please reply to my message with any questions. Thank you!   I recommend the Cologuard test for your colon cancer screening that is due. I have ordered this test for you. The Cologuard company will soon contact you to verify your insurance, address etc. They will then send you the kit; follow the instructions in the kit and return the kit to Colog[employer]. They will run the test and send the results to me. I will then give you the results. If this test is negative, we recommend repeating a colon cancer screening test in 3 years. If it is positive, I will refer you to a Gastroenterologist so you can get set up for the recommended colonoscopy.  Thank you!   Please call the office checked below to schedule your appointment for your mammogram and/or bone density screen (the checked studies were ordered): [x]   Mammogram  []   Bone Density  [x]   [facility]     [address] [place], [place]        [phone]         []   [facility]  [address] [place], [place]  [phone]   If you have any questions or concerns, please don't hesitate to send me a message via MyChart or call the office at [phone]. Thank you for visiting with [place] today! It's our pleasure caring for you.

## 2023-05-06 NOTE — Progress Notes (Signed)
Subjective  Chief Complaint  Patient presents with   Annual Exam    Pt here for Annual Exam and is not currently fasting. Mammo has not been scheduled yet.    HPI: Kelly [name] is a 53 y.o. female who presents to [facility] [place] today for a Female Wellness Visit. She also has the concerns and/or needs as listed above in the chief complaint. These will be addressed in addition to the Health Maintenance Visit.   Wellness Visit: annual visit with health maintenance review and exam  HM: pt is thriving. 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 Just returned from [place] where she studied for her exam; spent a month there. She is happy, divorced and her relationship with her children is mending. She is active and lives a healthy lifestyle. Lots of sports. Overdue for mammo, pap smear and eligible for cologuard in march. Imms up to date.  Chronic disease f/u and/or acute problem visit: (deemed necessary to be done in addition to the wellness visit): H/o hip replacement: requests tizanidine and meloxicam refill for rare prn use.  Has recurrent outbreaks on right buttocks. Most recently several weeks ago, now healing. Describes blisters and pain. ? Herpetic  Assessment  1. Annual physical exam   2. Cervical cancer screening   3. Screening mammogram for breast cancer   4. Screening for colorectal cancer   5. Status post total hip replacement, left   6. Herpetic dermatitis      Plan  Female Wellness Visit: Age appropriate Health Maintenance and Prevention measures were discussed with patient. Included topics are cancer screening recommendations, ways to keep healthy (see AVS) including dietary and exercise recommendations, regular eye and dental care, use of seat belts, and avoidance of moderate alcohol use and tobacco use. Mammo, pap and cologuard ordered w/ education BMI: discussed patient's BMI and encouraged positive lifestyle  modifications to help get to or maintain a target BMI. HM needs and immunizations were addressed and ordered. See below for orders. See HM and immunization section for updates. Routine labs and screening tests ordered including cmp, cbc and lipids where appropriate. Discussed recommendations regarding Vit D and calcium supplementation (see AVS)  Chronic disease management visit and/or acute problem visit: H/o hip replacement. Prn meloxicam and mm relaxer Likely herpetic dermatitis. Education given. Valtrex 500 bid x 5 day prn outbreaks.   Follow up: 12 mo for cpe  Orders Placed This Encounter  Procedures   MM DIGITAL SCREENING BILATERAL   CBC with Differential/Platelet   Comprehensive metabolic panel   Lipid panel   TSH   Cologuard   Meds ordered this encounter  Medications   meloxicam (MOBIC) 15 MG tablet    Sig: Take 1 tablet (15 mg total) by mouth daily as needed for pain.    Dispense:  30 tablet    Refill:  0   tiZANidine (ZANAFLEX) 4 MG tablet    Sig: Take 1 tablet (4 mg total) by mouth every 8 (eight) hours as needed for muscle spasms.    Dispense:  30 tablet    Refill:  0   valACYclovir (VALTREX) 500 MG tablet    Sig: Take 1 tablet (500 mg total) by mouth 2 (two) times daily for 5 days. As needed for skin outbreaks    Dispense:  30 tablet    Refill:  1      Body mass index is 21.83 kg/m. Wt Readings from  Last 3 Encounters:  05/06/23 143 lb 9.6 oz (65.1 kg)  09/17/21 142 lb 6.4 oz (64.6 kg)  08/17/21 139 lb 12.8 oz (63.4 kg)     Patient Active Problem List   Diagnosis Date Noted Date Diagnosed   Herpetic dermatitis 05/06/2023     Right buttocks    Adjustment disorder with mixed anxiety and depressed mood 07/13/2021    Status post total hip replacement, left 04/10/2020    Pars defect with spondylolisthesis 06/22/2019    Family history of osteoporosis in mother 04/09/2019    Health Maintenance  Topic Date Due   MAMMOGRAM  09/11/2022   Cervical Cancer  Screening (HPV/Pap Cotest)  04/03/2023   COVID-19 Vaccine (4 - 2024-25 season) 05/22/2023 (Originally 11/17/2022)   Fecal DNA (Cologuard)  06/01/2023   DTaP/Tdap/Td (2 - Td or Tdap) 04/08/2028   Hepatitis C Screening  Completed   HIV Screening  Completed   Zoster Vaccines- Shingrix  Completed   HPV VACCINES  Aged Out   INFLUENZA VACCINE  Discontinued   Immunization History  Administered Date(s) Administered   Influenza,inj,Quad PF,6+ Mos 04/09/2019   PFIZER(Purple Top)SARS-COV-2 Vaccination 05/13/2019, 06/04/2019, 12/24/2019   Tdap 04/08/2018   Zoster Recombinant(Shingrix) 08/17/2021, 10/24/2021   We updated and reviewed the patient's past history in detail and it is documented below. Allergies: Patient has no known allergies. Past Medical History Patient  has a past medical history of Family history of osteoporosis in mother (04/09/2019), Herpetic dermatitis (05/06/2023), and History of left hip replacement (03/02/2020). Past Surgical History Patient  has a past surgical history that includes Total hip arthroplasty (Left, 03/02/2020); Eye surgery (2002); and Joint replacement (03/02/2020). Family History: Patient family history includes Alcohol abuse in her brother and mother; COPD in her maternal grandmother; Cancer in her paternal grandmother; Healthy in her daughter and son; High blood pressure in her father; Osteoporosis in her mother; Stroke in her paternal grandfather. Social History:  Patient  reports that she has never smoked. She has never used smokeless tobacco. She reports current alcohol use of about 4.0 standard drinks of alcohol per week. She reports that she does not use drugs.  Review of Systems: Constitutional: negative for fever or malaise Ophthalmic: negative for photophobia, double vision or loss of vision Cardiovascular: negative for chest pain, dyspnea on exertion, or new LE swelling Respiratory: negative for SOB or persistent cough Gastrointestinal: negative  for abdominal pain, change in bowel habits or melena Genitourinary: negative for dysuria or gross hematuria, no abnormal uterine bleeding or disharge Musculoskeletal: negative for new gait disturbance or muscular weakness Integumentary: negative for new or persistent rashes, no breast lumps Neurological: negative for TIA or stroke symptoms Psychiatric: negative for SI or delusions Allergic/Immunologic: negative for hives  Patient Care Team    Relationship Specialty Notifications Start End  Willow Ora, MD PCP - General Family Medicine  04/02/18   Marcene Corning, MD Consulting Physician Orthopedic Surgery  04/10/20     Objective  Vitals: BP 135/86   Pulse 60   Temp 97.7 F (36.5 C)   Ht 5\' 8"  (1.727 m)   Wt 143 lb 9.6 oz (65.1 kg)   SpO2 96%   BMI 21.83 kg/m  General:  Well developed, well nourished, no acute distress  Psych:  Alert and orientedx3,normal mood and affect HEENT:  Normocephalic, atraumatic, non-icteric sclera,  supple neck without adenopathy, mass or thyromegaly Cardiovascular:  Normal S1, S2, RRR without gallop, rub or murmur Respiratory:  Good breath sounds bilaterally, CTAB with normal  respiratory effort Gastrointestinal: normal bowel sounds, soft, non-tender, no noted masses. No HSM MSK: extremities without edema, joints without erythema or swelling Neurologic:    Mental status is normal.  Gross motor and sensory exams are normal.  No tremor Skin: right buttocks with 3 annular healing areas Pelvic Exam: Normal external genitalia, no vulvar or vaginal lesions present. Clear cervix w/o CMT. Bimanual exam reveals a nontender fundus w/o masses, nl size. No adnexal masses present. No inguinal adenopathy. A PAP smear was performed.   Commons side effects, risks, benefits, and alternatives for medications and treatment plan prescribed today were discussed, and the patient expressed understanding of the given instructions. Patient is instructed to call or message via  MyChart if he/she has any questions or concerns regarding our treatment plan. No barriers to understanding were identified. We discussed Red Flag symptoms and signs in detail. Patient expressed understanding regarding what to do in case of urgent or emergency type symptoms.  Medication list was reconciled, printed and provided to the patient in AVS. Patient instructions and summary information was reviewed with the patient as documented in the AVS. This note was prepared with assistance of Dragon voice recognition software. Occasional wrong-word or sound-a-like substitutions may have occurred due to the inherent limitations of voice recognition software

## 2023-05-07 ENCOUNTER — Encounter: Payer: Self-pay | Admitting: Family Medicine

## 2023-05-07 ENCOUNTER — Other Ambulatory Visit: Payer: Self-pay | Admitting: Family Medicine

## 2023-05-07 DIAGNOSIS — Z1231 Encounter for screening mammogram for malignant neoplasm of breast: Secondary | ICD-10-CM

## 2023-05-07 NOTE — Progress Notes (Signed)
See mychart note Dear Ms. Axel, Everything looks good! The pap smear result will be back in about a week.  So good seeing you and glad you are well. Sincerely, Dr. Mardelle Matte

## 2023-05-09 LAB — CYTOLOGY - PAP
Comment: NEGATIVE
Comment: NEGATIVE
Comment: NEGATIVE
HPV 16: NEGATIVE
HPV 18 / 45: NEGATIVE
High risk HPV: POSITIVE — AB

## 2023-05-22 ENCOUNTER — Ambulatory Visit: Payer: Managed Care, Other (non HMO)

## 2023-05-26 ENCOUNTER — Encounter: Payer: Self-pay | Admitting: Family Medicine

## 2023-05-26 NOTE — Progress Notes (Signed)
 See mychart note Dear Ms. Alphin, Your pap smear shows changes consistent with low estrogen status. Your HPV testing is negative, therefore, these are negative results and we can repeat testing in 3 years. If you are having any symptoms like vaginal dryness or pain, we can discuss. Thanks! Sincerely, Dr. Mardelle Matte

## 2023-05-27 ENCOUNTER — Ambulatory Visit

## 2023-05-30 ENCOUNTER — Ambulatory Visit
Admission: RE | Admit: 2023-05-30 | Discharge: 2023-05-30 | Disposition: A | Discharge status: Home / Self Care | Source: Ambulatory Visit | Attending: Family Medicine | Admitting: Family Medicine

## 2023-05-30 DIAGNOSIS — Z1231 Encounter for screening mammogram for malignant neoplasm of breast: Secondary | ICD-10-CM

## 2023-12-08 ENCOUNTER — Ambulatory Visit (INDEPENDENT_AMBULATORY_CARE_PROVIDER_SITE_OTHER): Payer: MEDICAID | Admitting: Family Medicine

## 2023-12-08 VITALS — BP 151/85 | HR 63 | Temp 97.7°F | Ht 68.0 in | Wt 146.2 lb

## 2023-12-08 DIAGNOSIS — M7751 Other enthesopathy of right foot: Secondary | ICD-10-CM

## 2023-12-08 DIAGNOSIS — M67873 Other specified disorders of tendon, right ankle and foot: Secondary | ICD-10-CM | POA: Diagnosis not present

## 2023-12-08 MED ORDER — DICLOFENAC SODIUM 75 MG PO TBEC: 75.0000 mg | DELAYED_RELEASE_TABLET | Freq: Two times a day (BID) | ORAL | 0 refills | Status: DC

## 2023-12-08 NOTE — Patient Instructions (Signed)
 Please follow up if symptoms do not improve or as needed.    Achilles Tendinitis  Achilles tendinitis is inflammation of the tough, cord-like band that connects the lower leg muscles to the heel bone (Achilles tendon). This is often caused by using the tendon and ankle joint too much. In most cases, Achilles tendinitis gets better over time with treatment and home care. It can take weeks or months to fully heal. What are the causes? This condition may be caused by: A sudden increase in exercise or activity, such as running. Doing the same exercises or activities, such as jumping, over and over. Not warming up your calf muscles before you exercise. Exercising in shoes that are worn out or not made for exercise. Having arthritis or a bone growth (spur) on the back of your heel. This can rub against the tendon and hurt it. Age-related wear and tear. Tendons become less flexible with age and are more likely to be injured. What are the signs or symptoms? Common symptoms of this condition include: Pain in your Achilles tendon or in the back of your leg, just above your heel. The pain may get worse when you exercise. Stiffness or soreness in the back of your leg. You may feel it most often in the morning. Swelling of the skin over the Achilles tendon. Thickening of the tendon. Trouble standing on tiptoe. How is this diagnosed? This condition is diagnosed based on your symptoms and a physical exam. You may also have tests, such as: X-rays. MRI. Ultrasound. How is this treated? The goal of treatment is to relieve symptoms and help your injury heal. You may need to: Decrease or stop activities that caused the tendinitis. You may be told to switch to low-impact exercises like biking or swimming. Ice the injured area. Do physical therapy. This may include strengthening and stretching exercises. Take NSAIDs, such as ibuprofen . These can help with pain and swelling. Use supportive shoes, wraps,  heel lifts, or a walking boot (air cast). Have surgery. This may be done if your symptoms do not get better with other treatments. Use high-energy waves to start the healing process (extracorporeal shock wave therapy). This is rare. Get an injection of medicines that help with inflammation (corticosteroids). This is rare. Follow these instructions at home: If you have an air cast: Wear the air cast as told by your health care provider. Remove it only as told by your provider. Check the skin around the air cast every day. Tell your provider about any concerns. Loosen the air cast if your toes tingle, become numb, or turn cold and blue. Keep the air cast clean. If the air cast is not waterproof: Do not let it get wet. Cover it with a watertight covering when you take a bath or shower. Managing pain, stiffness, and swelling  If told, put ice on the injured area. If you have a removable air cast, remove it as told by your provider. Put ice in a plastic bag. Place a towel between your skin and the bag. Leave the ice on for 20 minutes, 2-3 times a day. If your skin turns bright red, remove the ice right away to prevent skin damage. The risk of damage is higher if you cannot feel pain, heat, or cold. Move your toes often to reduce stiffness and swelling. Raise (elevate) your foot above the level of your heart while you are sitting or lying down. Activity Do not do activities that cause pain. Ask your provider when it  is safe to drive if you have an air cast on your foot. If you go to physical therapy, do exercises as told by your provider or therapist. Return to your normal activities as told by your provider. Ask your provider what activities are safe for you. General instructions Take over-the-counter and prescription medicines only as told by your provider. If told, wrap your foot with an elastic bandage or other wrap. This can help to keep your tendon from moving too much while it heals.  Your provider will show you how to wrap your foot. Wear supportive shoes or heel lifts only as told by your provider. Contact a health care provider if: Your symptoms get worse. Your pain does not get better with medicine. You have new symptoms that you cannot explain. You have warmth and swelling in your foot. You have a fever. Get help right away if: You hear a sudden popping sound in your Achilles tendon and then have severe pain. You cannot move your toes or foot. You cannot put any weight on your foot. Your foot or toes become numb and look white or blue even after you loosen your bandage or air cast. This information is not intended to replace advice given to you by your health care provider. Make sure you discuss any questions you have with your health care provider. Document Revised: 11/23/2021 Document Reviewed: 11/23/2021 Elsevier Patient Education  2024 [employer].

## 2023-12-08 NOTE — Progress Notes (Signed)
 Subjective  CC:  Chief Complaint  Patient presents with   Foot Pain    Rt heel pain for the last month and seems to be getting better but the pain still lingers     HPI: Kelly [name] is a 53 y.o. female who presents to the office today to address the problems listed above in the chief complaint. Discussed the use of AI scribe software for clinical note transcription with the patient, who gave verbal consent to proceed.  History of Present Illness Kelly [name] is a 53 year old female who presents with suspected Achilles tendinitis due to overuse from playing football.  She began experiencing acute pain in her Achilles tendon approximately two months ago, which she attributes to playing football. The pain is described as feeling like 'micro tears' and began during a game. Despite the pain, she continued to play, which may have exacerbated the issue.  She has been managing the pain by slowing down her activities, icing the area, resting, and using a tight brace. She has not been taking any anti-inflammatory medications. The pain has been persistent but is reportedly improving. She experiences pain primarily in the back of her heel, which sometimes extends downwards when it flares up. The pain is most noticeable in the morning when she first gets out of bed, making her feel 'like I'm 53 years old'.  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 She is planning two hiking trips, one to [facility] and another to [place] , and is concerned about her ability to participate due to her current condition.  She has been stretching extensively to manage the pain.   Assessment  1. Achilles tendinosis of right ankle   2. 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  Symptoms include heel pain exacerbated by activity and mornings. Examination suggests bursitis and tendonopathy - Initiated anti-inflammatory medication for 10-14 days. - Advised rest, avoiding strenuous activities for two weeks. - Discussed importance of rest despite upcoming hiking trips. - Encouraged continued icing for inflammation management. -to sports medicine if can't get resolved. Gave stretching and strengthening exercises from Patient advisor.    Follow up: prn No orders of the defined types were placed in this encounter.  Meds ordered this encounter  Medications   diclofenac  (VOLTAREN ) 75 MG EC tablet    Sig: Take 1 tablet (75 mg total) by mouth 2 (two) times daily.    Dispense:  30 tablet    Refill:  0     I reviewed the patients updated PMH, FH, and SocHx.  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   diclofenac  (VOLTAREN ) 75 MG EC tablet Take 1 tablet (75 mg total) by mouth 2 (two) times daily.   FERROUS GLUCONATE PO Take 1 tablet by mouth every other day.   GLUCOSAMINE-CHONDROITIN PO Take 1 capsule by mouth daily.   meloxicam  (MOBIC ) 15 MG tablet Take 1 tablet (15 mg total) by mouth daily as  needed for pain.   Multiple Vitamin (MULTIVITAMIN WITH MINERALS) TABS tablet Take 1 tablet by mouth daily.   tiZANidine  (ZANAFLEX ) 4 MG tablet Take 1 tablet (4 mg total) by mouth every 8 (eight) hours as needed for muscle spasms.   Allergies: Patient has no known allergies. Family History: Patient family history includes Alcohol abuse in her brother and mother; COPD in her maternal grandmother; Cancer in her  paternal grandmother; Healthy in her daughter and son; High blood pressure in her father; Osteoporosis in her mother; Stroke in her paternal grandfather. Social History:  Patient  reports that she has never smoked. She has never used smokeless tobacco. She reports current alcohol use of about 4.0 standard drinks of alcohol per week. She reports that she does not use drugs.  Review of Systems: Constitutional: Negative for fever malaise or anorexia Cardiovascular: negative for chest pain Respiratory: negative for SOB or persistent cough Gastrointestinal: negative for abdominal pain  Objective  Vitals: BP (!) 151/85   Pulse 63   Temp 97.7 F (36.5 C)   Ht 5' 8 (1.727 m)   Wt 146 lb 3.2 oz (66.3 kg)   SpO2 98%   BMI 22.23 kg/m  General: no acute distress , A&Ox3 Right posterior heel: tender of retrocalc bursa, good ROM. No plantar ttp. Nl gait. No calf ttp or edema Commons side effects, risks, benefits, and alternatives for medications and treatment plan prescribed today were discussed, and the patient expressed understanding of the given instructions. Patient is instructed to call or message via MyChart if he/she has any questions or concerns regarding our treatment plan. No barriers to understanding were identified. We discussed Red Flag symptoms and signs in detail. Patient expressed understanding regarding what to do in case of urgent or emergency type symptoms.  Medication list was reconciled, printed and provided to the patient in AVS. Patient instructions and summary information was reviewed with the patient as documented in the AVS. This note was prepared with assistance of Dragon voice recognition software. Occasional wrong-word or sound-a-like substitutions may have occurred due to the inherent limitations of voice recognition software

## 2023-12-18 DIAGNOSIS — Z85828 Personal history of other malignant neoplasm of skin: Secondary | ICD-10-CM | POA: Diagnosis not present

## 2023-12-18 DIAGNOSIS — L821 Other seborrheic keratosis: Secondary | ICD-10-CM | POA: Diagnosis not present

## 2023-12-18 DIAGNOSIS — D1801 Hemangioma of skin and subcutaneous tissue: Secondary | ICD-10-CM | POA: Diagnosis not present

## 2023-12-18 DIAGNOSIS — L814 Other melanin hyperpigmentation: Secondary | ICD-10-CM | POA: Diagnosis not present

## 2023-12-18 DIAGNOSIS — Z08 Encounter for follow-up examination after completed treatment for malignant neoplasm: Secondary | ICD-10-CM | POA: Diagnosis not present

## 2024-01-23 ENCOUNTER — Other Ambulatory Visit: Payer: Self-pay | Admitting: Family Medicine

## 2024-01-23 DIAGNOSIS — H5203 Hypermetropia, bilateral: Secondary | ICD-10-CM | POA: Diagnosis not present

## 2024-05-06 ENCOUNTER — Encounter: Payer: Managed Care, Other (non HMO) | Admitting: Family Medicine
